# Patient Record
Sex: Female | Born: 1970 | Race: White | Hispanic: No | Marital: Married | Smoking: Current every day smoker
Health system: Southern US, Community
[De-identification: ages and names within clinical notes are randomized; demographics above are authoritative.]

## PROBLEM LIST (undated history)

## (undated) DIAGNOSIS — F419 Anxiety disorder, unspecified: Secondary | ICD-10-CM

## (undated) DIAGNOSIS — K219 Gastro-esophageal reflux disease without esophagitis: Secondary | ICD-10-CM

## (undated) DIAGNOSIS — G43909 Migraine, unspecified, not intractable, without status migrainosus: Secondary | ICD-10-CM

## (undated) DIAGNOSIS — I10 Essential (primary) hypertension: Secondary | ICD-10-CM

## (undated) DIAGNOSIS — E7800 Pure hypercholesterolemia, unspecified: Secondary | ICD-10-CM

## (undated) HISTORY — PX: FOOT SURGERY: SHX648

## (undated) HISTORY — DX: Migraine, unspecified, not intractable, without status migrainosus: G43.909

## (undated) HISTORY — PX: OTHER SURGICAL HISTORY: SHX169

## (undated) HISTORY — DX: Pure hypercholesterolemia, unspecified: E78.00

## (undated) HISTORY — DX: Gastro-esophageal reflux disease without esophagitis: K21.9

## (undated) HISTORY — DX: Anxiety disorder, unspecified: F41.9

---

## 1998-07-01 ENCOUNTER — Emergency Department (HOSPITAL_COMMUNITY): Admission: EM | Admit: 1998-07-01 | Discharge: 1998-07-01 | Payer: Self-pay | Admitting: Emergency Medicine

## 2013-09-05 ENCOUNTER — Encounter (HOSPITAL_COMMUNITY)
Admission: RE | Admit: 2013-09-05 | Discharge: 2013-09-05 | Disposition: A | Discharge status: Home / Self Care | Payer: BC Managed Care – PPO | Source: Ambulatory Visit | Attending: Obstetrics & Gynecology | Admitting: Obstetrics & Gynecology

## 2013-09-05 ENCOUNTER — Encounter (HOSPITAL_COMMUNITY): Payer: Self-pay

## 2013-09-05 ENCOUNTER — Encounter (INDEPENDENT_AMBULATORY_CARE_PROVIDER_SITE_OTHER): Payer: Self-pay

## 2013-09-05 ENCOUNTER — Encounter (HOSPITAL_COMMUNITY): Payer: Self-pay | Admitting: Pharmacist

## 2013-09-05 DIAGNOSIS — Z01812 Encounter for preprocedural laboratory examination: Secondary | ICD-10-CM | POA: Insufficient documentation

## 2013-09-05 DIAGNOSIS — N920 Excessive and frequent menstruation with regular cycle: Secondary | ICD-10-CM | POA: Insufficient documentation

## 2013-09-05 LAB — CBC
HCT: 39.1 % (ref 36.0–46.0)
HEMOGLOBIN: 13.5 g/dL (ref 12.0–15.0)
MCH: 31.8 pg (ref 26.0–34.0)
MCHC: 34.5 g/dL (ref 30.0–36.0)
MCV: 92 fL (ref 78.0–100.0)
PLATELETS: 256 10*3/uL (ref 150–400)
RBC: 4.25 MIL/uL (ref 3.87–5.11)
RDW: 12.9 % (ref 11.5–15.5)
WBC: 7.4 10*3/uL (ref 4.0–10.5)

## 2013-09-05 NOTE — Patient Instructions (Addendum)
   Your procedure is scheduled on:  Thursday, Mar 26  Enter through the [employer] of [facility] at:  6 AM Pick up the phone at the desk and dial [phone] and inform us of your arrival.  Please call this number if you have any problems the morning of surgery: [phone]  Remember: Do not eat or drink after midnight:  Wednesday Take these medicines the morning of surgery with a SIP OF WATER:  None  Do not wear jewelry, make-up, or FINGER nail polish No metal in your hair or on your body. Do not wear lotions, powders, perfumes.  You may wear deodorant.  Do not bring valuables to the hospital. Contacts, dentures or bridgework may not be worn into surgery.  Leave suitcase in the car. After Surgery it may be brought to your room. For patients being admitted to the hospital, checkout time is 11:00am the day of discharge.  Home with husband [place] cell [phone].

## 2013-09-09 NOTE — H&P (Signed)
Melanie Powell is an 43 y.o. female with menorrhagia and dysmenorrhea.  She declines medical management and wants definitive management.  Uterus on ultrasound is 224 cc volume with 5.7cm fibroid noted.  The patient does admit to consuming 6-8 beers nightly.  She wants ovarian preservation.  Pertinent Gynecological History: Menses: flow is excessive with use of 10 pads or tampons on heaviest days Bleeding: dysfunctional uterine bleeding Contraception: vasectomy DES exposure: unknown Blood transfusions: none Sexually transmitted diseases: no past history Previous GYN Procedures: none  Last mammogram: n/a Date: n/a Last pap: normal Date: 69 OB History: G3, P3   Menstrual History: Menarche age: n/a No LMP recorded.    Past Medical History  Diagnosis Date  . SVD (spontaneous vaginal delivery)     x 3    Past Surgical History  Procedure Laterality Date  . Right side lung surgery      portaion of right lower lobe removed per patient - pt was in her 20's - no problems ever after surgery    No family history on file.  Social History:  reports that she has been smoking.  She has never used smokeless tobacco. She reports that she drinks alcohol. She reports that she does not use illicit drugs.  Allergies: No Known Allergies  No prescriptions prior to admission    ROS  There were no vitals taken for this visit. Physical Exam  Constitutional: She is oriented to person, place, and time. She appears well-developed and well-nourished.  GI: Soft. There is no rebound and no guarding.  Neurological: She is alert and oriented to person, place, and time.  Skin: Skin is warm and dry.  Psychiatric: She has a normal mood and affect. Her behavior is normal.    No results found for this or any previous visit (from the past 24 hour(s)).  No results found.  Assessment/Plan: 43yo G3P3 with menorrhagia, dysmenorrhea -LAVH  Melanie Powell 09/09/2013, 8:57 PM

## 2013-09-10 NOTE — Pre-Procedure Instructions (Signed)
Dr. Josephine Igo made aware of conversation I had with heather at Dr. Lynnette Caffey' office.  Nira Conn stated pt has history of alcohol abuse.  I made Dr. Royce Macadamia aware labs orders and place in [facility] for day of surgery.

## 2013-09-11 ENCOUNTER — Encounter (HOSPITAL_COMMUNITY)
Admission: RE | Disposition: A | Discharge status: Home / Self Care | Payer: Self-pay | Source: Ambulatory Visit | Attending: Obstetrics & Gynecology

## 2013-09-11 ENCOUNTER — Ambulatory Visit (HOSPITAL_COMMUNITY): Payer: BC Managed Care – PPO | Admitting: Anesthesiology

## 2013-09-11 ENCOUNTER — Encounter (HOSPITAL_COMMUNITY): Payer: Self-pay

## 2013-09-11 ENCOUNTER — Encounter (HOSPITAL_COMMUNITY): Payer: BC Managed Care – PPO | Admitting: Anesthesiology

## 2013-09-11 ENCOUNTER — Observation Stay (HOSPITAL_COMMUNITY)
Admission: RE | Admit: 2013-09-11 | Discharge: 2013-09-12 | Disposition: A | Discharge status: Home / Self Care | Payer: BC Managed Care – PPO | Source: Ambulatory Visit | Attending: Obstetrics & Gynecology | Admitting: Obstetrics & Gynecology

## 2013-09-11 DIAGNOSIS — N920 Excessive and frequent menstruation with regular cycle: Principal | ICD-10-CM | POA: Insufficient documentation

## 2013-09-11 DIAGNOSIS — D250 Submucous leiomyoma of uterus: Secondary | ICD-10-CM | POA: Insufficient documentation

## 2013-09-11 DIAGNOSIS — N946 Dysmenorrhea, unspecified: Secondary | ICD-10-CM | POA: Insufficient documentation

## 2013-09-11 DIAGNOSIS — N852 Hypertrophy of uterus: Secondary | ICD-10-CM | POA: Insufficient documentation

## 2013-09-11 DIAGNOSIS — N906 Unspecified hypertrophy of vulva: Secondary | ICD-10-CM | POA: Insufficient documentation

## 2013-09-11 DIAGNOSIS — F17200 Nicotine dependence, unspecified, uncomplicated: Secondary | ICD-10-CM | POA: Insufficient documentation

## 2013-09-11 DIAGNOSIS — L819 Disorder of pigmentation, unspecified: Secondary | ICD-10-CM | POA: Insufficient documentation

## 2013-09-11 DIAGNOSIS — Z90710 Acquired absence of both cervix and uterus: Secondary | ICD-10-CM | POA: Diagnosis present

## 2013-09-11 HISTORY — PX: LAPAROSCOPIC ASSISTED VAGINAL HYSTERECTOMY: SHX5398

## 2013-09-11 LAB — COMPREHENSIVE METABOLIC PANEL
ALK PHOS: 65 U/L (ref 39–117)
ALT: 20 U/L (ref 0–35)
AST: 19 U/L (ref 0–37)
Albumin: 3.8 g/dL (ref 3.5–5.2)
BUN: 11 mg/dL (ref 6–23)
CHLORIDE: 103 meq/L (ref 96–112)
CO2: 25 mEq/L (ref 19–32)
CREATININE: 0.81 mg/dL (ref 0.50–1.10)
Calcium: 9.5 mg/dL (ref 8.4–10.5)
GFR, EST NON AFRICAN AMERICAN: 90 mL/min — AB (ref >90)
GLUCOSE: 105 mg/dL — AB (ref 70–99)
POTASSIUM: 3.9 meq/L (ref 3.7–5.3)
Sodium: 139 mEq/L (ref 137–147)
Total Bilirubin: 0.2 mg/dL — ABNORMAL LOW (ref 0.3–1.2)
Total Protein: 6.7 g/dL (ref 6.0–8.3)

## 2013-09-11 LAB — CBC
HEMATOCRIT: 39.1 % (ref 36.0–46.0)
Hemoglobin: 13.1 g/dL (ref 12.0–15.0)
MCH: 31.4 pg (ref 26.0–34.0)
MCHC: 33.5 g/dL (ref 30.0–36.0)
MCV: 93.8 fL (ref 78.0–100.0)
Platelets: 264 10*3/uL (ref 150–400)
RBC: 4.17 MIL/uL (ref 3.87–5.11)
RDW: 13.1 % (ref 11.5–15.5)
WBC: 9.5 10*3/uL (ref 4.0–10.5)

## 2013-09-11 LAB — APTT: aPTT: 30 seconds (ref 24–37)

## 2013-09-11 LAB — PROTIME-INR
INR: 0.93 (ref 0.00–1.49)
Prothrombin Time: 12.3 seconds (ref 11.6–15.2)

## 2013-09-11 LAB — PREGNANCY, URINE: PREG TEST UR: NEGATIVE

## 2013-09-11 SURGERY — HYSTERECTOMY, VAGINAL, LAPAROSCOPY-ASSISTED
Anesthesia: General | Site: Abdomen

## 2013-09-11 MED ORDER — BUPIVACAINE HCL (PF) 0.25 % IJ SOLN
INTRAMUSCULAR | Status: DC | PRN
Start: 2013-09-11 — End: 2013-09-11
  Administered 2013-09-11: 4 mL

## 2013-09-11 MED ORDER — ONDANSETRON HCL 4 MG PO TABS
4.0000 mg | ORAL_TABLET | Freq: Four times a day (QID) | ORAL | Status: DC | PRN
Start: 2013-09-11 — End: 2013-09-12

## 2013-09-11 MED ORDER — MIDAZOLAM HCL 2 MG/2ML IJ SOLN
INTRAMUSCULAR | Status: DC | PRN
  Administered 2013-09-11: 2 mg via INTRAVENOUS

## 2013-09-11 MED ORDER — DEXTROSE IN LACTATED RINGERS 5 % IV SOLN
INTRAVENOUS | Status: DC
  Administered 2013-09-11: 13:00:00 via INTRAVENOUS

## 2013-09-11 MED ORDER — NEOSTIGMINE METHYLSULFATE 1 MG/ML IJ SOLN
INTRAMUSCULAR | Status: DC | PRN
  Administered 2013-09-11: 4 mg via INTRAVENOUS

## 2013-09-11 MED ORDER — DEXTROSE IN LACTATED RINGERS 5 % IV SOLN: INTRAVENOUS | Status: DC

## 2013-09-11 MED ORDER — PROPOFOL 10 MG/ML IV EMUL
INTRAVENOUS | Status: AC
  Filled 2013-09-11: qty 20

## 2013-09-11 MED ORDER — FENTANYL CITRATE 0.05 MG/ML IJ SOLN
INTRAMUSCULAR | Status: DC | PRN
  Administered 2013-09-11 (×2): 50 ug via INTRAVENOUS
  Administered 2013-09-11: 100 ug via INTRAVENOUS
  Administered 2013-09-11: 25 ug via INTRAVENOUS
  Administered 2013-09-11: 50 ug via INTRAVENOUS
  Administered 2013-09-11: 100 ug via INTRAVENOUS
  Administered 2013-09-11: 75 ug via INTRAVENOUS
  Administered 2013-09-11: 50 ug via INTRAVENOUS

## 2013-09-11 MED ORDER — ROCURONIUM BROMIDE 100 MG/10ML IV SOLN
INTRAVENOUS | Status: AC
  Filled 2013-09-11: qty 1

## 2013-09-11 MED ORDER — LIDOCAINE HCL (CARDIAC) 20 MG/ML IV SOLN
INTRAVENOUS | Status: DC | PRN
  Administered 2013-09-11: 30 mg via INTRAVENOUS

## 2013-09-11 MED ORDER — KETOROLAC TROMETHAMINE 30 MG/ML IJ SOLN: 30.0000 mg | Freq: Four times a day (QID) | INTRAMUSCULAR | Status: DC

## 2013-09-11 MED ORDER — FENTANYL CITRATE 0.05 MG/ML IJ SOLN
INTRAMUSCULAR | Status: AC
  Filled 2013-09-11: qty 5

## 2013-09-11 MED ORDER — HYDROMORPHONE HCL PF 1 MG/ML IJ SOLN
0.2000 mg | INTRAMUSCULAR | Status: DC | PRN
  Administered 2013-09-11: 0.6 mg via INTRAVENOUS
  Filled 2013-09-11: qty 1

## 2013-09-11 MED ORDER — LIDOCAINE HCL (CARDIAC) 20 MG/ML IV SOLN
INTRAVENOUS | Status: AC
  Filled 2013-09-11: qty 5

## 2013-09-11 MED ORDER — ONDANSETRON HCL 4 MG/2ML IJ SOLN
INTRAMUSCULAR | Status: DC | PRN
  Administered 2013-09-11: 4 mg via INTRAVENOUS

## 2013-09-11 MED ORDER — KETOROLAC TROMETHAMINE 30 MG/ML IJ SOLN
30.0000 mg | Freq: Four times a day (QID) | INTRAMUSCULAR | Status: DC
  Administered 2013-09-11 – 2013-09-12 (×4): 30 mg via INTRAVENOUS
  Filled 2013-09-11 (×4): qty 1

## 2013-09-11 MED ORDER — ALUM & MAG HYDROXIDE-SIMETH 200-200-20 MG/5ML PO SUSP: 30.0000 mL | ORAL | Status: DC | PRN

## 2013-09-11 MED ORDER — MEPERIDINE HCL 25 MG/ML IJ SOLN: 6.2500 mg | INTRAMUSCULAR | Status: DC | PRN

## 2013-09-11 MED ORDER — LACTATED RINGERS IR SOLN
Status: DC | PRN
  Administered 2013-09-11: 3000 mL

## 2013-09-11 MED ORDER — DEXAMETHASONE SODIUM PHOSPHATE 10 MG/ML IJ SOLN
INTRAMUSCULAR | Status: DC | PRN
  Administered 2013-09-11: 5 mg via INTRAVENOUS

## 2013-09-11 MED ORDER — CEFAZOLIN SODIUM-DEXTROSE 2-3 GM-% IV SOLR
INTRAVENOUS | Status: AC
  Filled 2013-09-11: qty 50

## 2013-09-11 MED ORDER — CEFAZOLIN SODIUM-DEXTROSE 2-3 GM-% IV SOLR
2.0000 g | INTRAVENOUS | Status: AC
  Administered 2013-09-11: 2 g via INTRAVENOUS

## 2013-09-11 MED ORDER — HYDROMORPHONE HCL PF 1 MG/ML IJ SOLN
0.2500 mg | INTRAMUSCULAR | Status: DC | PRN
  Administered 2013-09-11 (×2): 0.5 mg via INTRAVENOUS

## 2013-09-11 MED ORDER — HYDROMORPHONE HCL PF 1 MG/ML IJ SOLN
INTRAMUSCULAR | Status: DC | PRN
  Administered 2013-09-11: .25 mg via INTRAVENOUS

## 2013-09-11 MED ORDER — ONDANSETRON HCL 4 MG/2ML IJ SOLN
INTRAMUSCULAR | Status: AC
  Filled 2013-09-11: qty 2

## 2013-09-11 MED ORDER — DEXAMETHASONE SODIUM PHOSPHATE 10 MG/ML IJ SOLN
INTRAMUSCULAR | Status: AC
  Filled 2013-09-11: qty 1

## 2013-09-11 MED ORDER — LORAZEPAM 1 MG PO TABS: 1.0000 mg | ORAL_TABLET | Freq: Four times a day (QID) | ORAL | Status: DC | PRN

## 2013-09-11 MED ORDER — MENTHOL 3 MG MT LOZG: 1.0000 | LOZENGE | OROMUCOSAL | Status: DC | PRN

## 2013-09-11 MED ORDER — MIDAZOLAM HCL 2 MG/2ML IJ SOLN
INTRAMUSCULAR | Status: AC
  Filled 2013-09-11: qty 2

## 2013-09-11 MED ORDER — SIMETHICONE 80 MG PO CHEW: 80.0000 mg | CHEWABLE_TABLET | Freq: Four times a day (QID) | ORAL | Status: DC | PRN

## 2013-09-11 MED ORDER — ROCURONIUM BROMIDE 100 MG/10ML IV SOLN
INTRAVENOUS | Status: DC | PRN
  Administered 2013-09-11: 5 mg via INTRAVENOUS
  Administered 2013-09-11: 10 mg via INTRAVENOUS
  Administered 2013-09-11: 35 mg via INTRAVENOUS
  Administered 2013-09-11: 10 mg via INTRAVENOUS
  Administered 2013-09-11 (×5): 5 mg via INTRAVENOUS

## 2013-09-11 MED ORDER — DOCUSATE SODIUM 100 MG PO CAPS
100.0000 mg | ORAL_CAPSULE | Freq: Two times a day (BID) | ORAL | Status: DC
  Administered 2013-09-11: 100 mg via ORAL
  Filled 2013-09-11: qty 1

## 2013-09-11 MED ORDER — LACTATED RINGERS IV SOLN: INTRAVENOUS | Status: DC

## 2013-09-11 MED ORDER — BUPIVACAINE HCL (PF) 0.25 % IJ SOLN
INTRAMUSCULAR | Status: AC
  Filled 2013-09-11: qty 30

## 2013-09-11 MED ORDER — GLYCOPYRROLATE 0.2 MG/ML IJ SOLN
INTRAMUSCULAR | Status: DC | PRN
  Administered 2013-09-11: .8 mg via INTRAVENOUS

## 2013-09-11 MED ORDER — OXYCODONE-ACETAMINOPHEN 5-325 MG PO TABS
1.0000 | ORAL_TABLET | ORAL | Status: DC | PRN
  Administered 2013-09-11 – 2013-09-12 (×3): 1 via ORAL
  Filled 2013-09-11: qty 2
  Filled 2013-09-11 (×2): qty 1

## 2013-09-11 MED ORDER — LACTATED RINGERS IV SOLN
INTRAVENOUS | Status: DC
  Administered 2013-09-11 (×3): via INTRAVENOUS

## 2013-09-11 MED ORDER — PROMETHAZINE HCL 25 MG/ML IJ SOLN: 6.2500 mg | INTRAMUSCULAR | Status: DC | PRN

## 2013-09-11 MED ORDER — PROPOFOL 10 MG/ML IV BOLUS
INTRAVENOUS | Status: DC | PRN
  Administered 2013-09-11: 150 mg via INTRAVENOUS
  Administered 2013-09-11 (×2): 50 mg via INTRAVENOUS

## 2013-09-11 MED ORDER — HYDROMORPHONE HCL PF 1 MG/ML IJ SOLN
INTRAMUSCULAR | Status: DC | PRN
Start: 2013-09-11 — End: 2013-09-11

## 2013-09-11 MED ORDER — HYDROMORPHONE HCL PF 1 MG/ML IJ SOLN
INTRAMUSCULAR | Status: AC
  Filled 2013-09-11: qty 1

## 2013-09-11 MED ORDER — HYDROMORPHONE HCL PF 1 MG/ML IJ SOLN
INTRAMUSCULAR | Status: AC
  Administered 2013-09-11: 0.5 mg via INTRAVENOUS
  Filled 2013-09-11: qty 1

## 2013-09-11 MED ORDER — ONDANSETRON HCL 4 MG/2ML IJ SOLN: 4.0000 mg | Freq: Four times a day (QID) | INTRAMUSCULAR | Status: DC | PRN

## 2013-09-11 SURGICAL SUPPLY — 47 items
ADH SKN CLS APL DERMABOND .7 (GAUZE/BANDAGES/DRESSINGS) ×1
BLADE 15 SAFETY STRL DISP (BLADE) ×2 IMPLANT
CABLE HIGH FREQUENCY MONO STRZ (ELECTRODE) IMPLANT
CANISTER SUCT 3000ML (MISCELLANEOUS) ×3 IMPLANT
CATH ROBINSON RED A/P 16FR (CATHETERS) ×3 IMPLANT
CHLORAPREP W/TINT 26ML (MISCELLANEOUS) ×3 IMPLANT
CLOTH BEACON ORANGE TIMEOUT ST (SAFETY) ×3 IMPLANT
COVER TABLE BACK 60X90 (DRAPES) ×3 IMPLANT
DECANTER SPIKE VIAL GLASS SM (MISCELLANEOUS) ×2 IMPLANT
DERMABOND ADVANCED (GAUZE/BANDAGES/DRESSINGS) ×2
DERMABOND ADVANCED .7 DNX12 (GAUZE/BANDAGES/DRESSINGS) ×1 IMPLANT
DRSG TELFA 3X8 NADH (GAUZE/BANDAGES/DRESSINGS) ×3 IMPLANT
ELECT LIGASURE LONG (ELECTRODE) ×2 IMPLANT
ELECT REM PT RETURN 9FT ADLT (ELECTROSURGICAL) ×3
ELECTRODE REM PT RTRN 9FT ADLT (ELECTROSURGICAL) IMPLANT
FILTER SMOKE EVAC LAPAROSHD (FILTER) ×3 IMPLANT
FORCEPS CUTTING 45CM 5MM (CUTTING FORCEPS) ×2 IMPLANT
GLOVE BIO SURGEON STRL SZ 6 (GLOVE) ×6 IMPLANT
GLOVE BIOGEL PI IND STRL 6 (GLOVE) ×2 IMPLANT
GLOVE BIOGEL PI IND STRL 7.0 (GLOVE) IMPLANT
GLOVE BIOGEL PI INDICATOR 6 (GLOVE) ×4
GLOVE BIOGEL PI INDICATOR 7.0 (GLOVE) ×8
GLOVE ECLIPSE 6.5 STRL STRAW (GLOVE) ×6 IMPLANT
GLOVE ECLIPSE 7.0 STRL STRAW (GLOVE) ×6 IMPLANT
GLOVE SURG SS PI 7.0 STRL IVOR (GLOVE) ×16 IMPLANT
GOWN STRL REUS W/TWL LRG LVL3 (GOWN DISPOSABLE) ×12 IMPLANT
MANIPULATOR UTERINE 4.5 ZUMI (MISCELLANEOUS) ×3 IMPLANT
NEEDLE INSUFFLATION 120MM (ENDOMECHANICALS) ×3 IMPLANT
NS IRRIG 1000ML POUR BTL (IV SOLUTION) ×3 IMPLANT
PACK LAVH (CUSTOM PROCEDURE TRAY) ×3 IMPLANT
PAD DRESSING TELFA 3X8 NADH (GAUZE/BANDAGES/DRESSINGS) IMPLANT
PROTECTOR NERVE ULNAR (MISCELLANEOUS) ×5 IMPLANT
SET IRRIG TUBING LAPAROSCOPIC (IRRIGATION / IRRIGATOR) ×2 IMPLANT
SUT MNCRL 0 MO-4 VIOLET 18 CR (SUTURE) ×2 IMPLANT
SUT MNCRL AB 3-0 PS2 27 (SUTURE) ×3 IMPLANT
SUT MON AB 2-0 CT1 36 (SUTURE) ×3 IMPLANT
SUT MONOCRYL 0 MO 4 18  CR/8 (SUTURE) ×4
SUT VIC AB 3-0 SH 27 (SUTURE) ×6
SUT VIC AB 3-0 SH 27X BRD (SUTURE) IMPLANT
SUT VICRYL 0 TIES 12 18 (SUTURE) ×3 IMPLANT
SUT VICRYL 0 UR6 27IN ABS (SUTURE) ×3 IMPLANT
TOWEL OR 17X24 6PK STRL BLUE (TOWEL DISPOSABLE) ×6 IMPLANT
TRAY FOLEY CATH 14FR (SET/KITS/TRAYS/PACK) ×3 IMPLANT
TROCAR XCEL NON-BLD 11X100MML (ENDOMECHANICALS) ×3 IMPLANT
TROCAR XCEL NON-BLD 5MMX100MML (ENDOMECHANICALS) ×3 IMPLANT
TROCAR XCEL OPT SLVE 5M 100M (ENDOMECHANICALS) ×3 IMPLANT
WARMER LAPAROSCOPE (MISCELLANEOUS) ×3 IMPLANT

## 2013-09-11 NOTE — Anesthesia Preprocedure Evaluation (Signed)
Anesthesia Evaluation  Patient identified by MRN, date of birth, ID band Patient awake    Reviewed: Allergy & Precautions, H&P , NPO status , Patient's Chart, lab work & pertinent test results  Airway Mallampati: II TM Distance: >3 FB Neck ROM: Full    Dental no notable dental hx.    Pulmonary Current Smoker,  breath sounds clear to auscultation  Pulmonary exam normal       Cardiovascular negative cardio ROS  Rhythm:Regular Rate:Normal     Neuro/Psych negative neurological ROS  negative psych ROS   GI/Hepatic negative GI ROS, Neg liver ROS,   Endo/Other  negative endocrine ROS  Renal/GU negative Renal ROS  negative genitourinary   Musculoskeletal negative musculoskeletal ROS (+)   Abdominal   Peds negative pediatric ROS (+)  Hematology negative hematology ROS (+)   Anesthesia Other Findings   Reproductive/Obstetrics negative OB ROS                           Anesthesia Physical Anesthesia Plan  ASA: II  Anesthesia Plan: General   Post-op Pain Management:    Induction: Intravenous  Airway Management Planned: Oral ETT  Additional Equipment:   Intra-op Plan:   Post-operative Plan: Extubation in OR  Informed Consent: I have reviewed the patients History and Physical, chart, labs and discussed the procedure including the risks, benefits and alternatives for the proposed anesthesia with the patient or authorized representative who has indicated his/her understanding and acceptance.   Dental advisory given  Plan Discussed with: CRNA  Anesthesia Plan Comments:         Anesthesia Quick Evaluation  

## 2013-09-11 NOTE — Progress Notes (Signed)
No change to H&P. 

## 2013-09-11 NOTE — Anesthesia Postprocedure Evaluation (Signed)
  Anesthesia Post-op Note  Patient: Melanie Powell  Procedure(s) Performed: Procedure(s): LAPAROSCOPIC ASSISTED VAGINAL HYSTERECTOMY with bilateral salpingectomy and right labial biopsy with posterior fourchette (N/A)  Patient Location: [facility]  Anesthesia Type:General  Level of Consciousness: awake  Airway and Oxygen Therapy: Patient Spontanous Breathing  Post-op Pain: mild  Post-op Assessment: Patient's Cardiovascular Status Stable and Respiratory Function Stable  Post-op Vital Signs: stable  Complications: No apparent anesthesia complications

## 2013-09-11 NOTE — Addendum Note (Signed)
Addendum created 09/11/13 1527 by Ignacia Bayley, CRNA   Modules edited: Notes Section   Notes Section:  File: [id number]

## 2013-09-11 NOTE — Transfer of Care (Signed)
Immediate Anesthesia Transfer of Care Note  Patient: Melanie Powell  Procedure(s) Performed: Procedure(s): LAPAROSCOPIC ASSISTED VAGINAL HYSTERECTOMY with bilateral salpingectomy and right labial biopsy with posterior fourchette (N/A)  Patient Location: PACU  Anesthesia Type:General  Level of Consciousness: awake and alert   Airway & Oxygen Therapy: Patient Spontanous Breathing and Patient connected to nasal cannula oxygen  Post-op Assessment: Report given to PACU RN and Post -op Vital signs reviewed and stable  Post vital signs: Reviewed and stable  Complications: No apparent anesthesia complications

## 2013-09-11 NOTE — Op Note (Signed)
PROCEDURE DATE: 09/11/2013 PREOPERATIVE DIAGNOSIS: Menorrhagia, dysmenorrhea  POSTOPERATIVE DIAGNOSIS: The same  PROCEDURE: Laparoscopic Assisted Vaginal Hysterectomy, posterior fourchette and right labial biopsies SURGEON: Dr. Linda Hedges  ASSISTANT: Dr. Molli Posey INDICATIONS: 43 y.o. G3P3 with menorrhagia and dysmenorrhea desiring definitive surgical management. Risks of surgery were discussed with the patient including but not limited to: bleeding which may require transfusion or reoperation; infection which may require antibiotics; injury to bowel, bladder, ureters or other surrounding organs; need for additional procedures including laparotomy; thromboembolic phenomenon, incisional problems and other postoperative/anesthesia complications. Written informed consent was obtained.  FINDINGS: Enlarged uterus (256 gm), normal adnexa bilaterally. No evidence of endometriosis. Normal upper abdomen. Normal appendix ANESTHESIA: General  ESTIMATED BLOOD LOSS: 150 ml  SPECIMENS: Uterus, fallopian tubes, and cervix, posterior fourchette biopsy, right labial biopsy  COMPLICATIONS: None immediate  PROCEDURE IN DETAIL: The patient received intravenous antibiotics and had sequential compression devices applied to her lower extremities while in the preoperative area. She was then taken to the operating room where general anesthesia was administered and was found to be adequate. She was placed in the dorsal lithotomy position, and was prepped and draped in a sterile manner. An in and out catheterization was performed. A uterine manipulator was then advanced into the uterus . After an adequate timeout was performed, attention was then turned to the patient's abdomen where a 10-mm skin incision was made in the umbilical fold. The Veress needle was carefully introduced into the peritoneal cavity through the abdominal wall. Intraperitoneal placement was confirmed by drop in intraabdominal pressure with insufflation  of carbon dioxide gas. Adequate pneumoperitoneum was obtained, and the 10 mm trocar and sleeve were then advanced without difficulty into the abdomen where intraabdominal placement was confirmed by the laparoscope. A survey of the patient's pelvis and abdomen revealed the above listed findings. Suprapubic 5 mm port was then placed under direct visualization. The pelvis was then carefully examined. On the right side, the fallopian tube was freed from the mesosalpinx.  The round ligament was then clamped and transected with the Gyrus. The uteroovarian ligament was also clamped and transected. The leaves of the broad ligament were separated and serially transected. These procedures were then repeated on the left side. The ureters were noted to be safely away from the area of dissection.  At this point, attention was turned to the vaginal portion of the case. A weighted speculum was placed posteriorly, a Deaver anteriorly, and the cervix grasped with a thyroid tenaculum. Once the anterior and posterior reflections were identified, the cervix was circumscribed using the Bovie knife. Next, using Mayos, the posterior cul-de-sac was entered. The LigaSure was then used to grasp the uterosacrals which were coapted and cut. Next, the bladder reflection was identified. Using Metzenbaums, it was entered and palpation and direct visualization confirmed proper location. Next, using the LigaSure, the uterine arteries were coapted and cut bilaterally. The pedicles were visualized after coaptation and were hemostatic. The same was performed sequentially cephalad until the uterus and cervix were removed after morcellation. The pedicles were inspected and found to be hemostatic.  Next, the uterosacrals were tagged with monocryl bilaterally. The posterior peritoneum was closed using monocryl in a purse-string fashion. The uterosacrals were brought together in the midline cuff closure with a figure-of-8 stitch using monocryl followed  by the remainder of the cuff closure in the same fashion. The cuff was inspected and found to be hemostatic. Using an 11 blade, the hyperpigmented area of the posterior fourchette was removed.  The  base was rendered hemostatic using Bovie cautery.  The skin edges were reapproximated using 3-0 Vicryl using horizontal mattress stitches.  The hyperpigmented area on the right labia was treated in the same fashion with an elipse removed.  Hemostasis of both areas was noted.    Attention was returned to the abdomen were a second laparoscopic look was taken. All pedicles were hemostatic. Insufflation was removed after all instruments were removed.  The infraumbilical fascial incision was closed with a 2-0 figure-of-eight vicryl stitch.  All skin incisions were closed with 4-0 Vicryl subcuticular stitches and Dermabond. The patient tolerated the procedures well. All instruments, needles, and sponge counts were correct x 2. The patient was taken to the recovery room awake, extubated and in stable condition.

## 2013-09-11 NOTE — Progress Notes (Signed)
-  NOS-  No current c/o.  Tolerating full diet.  No nausea or vomiting.  Well-controlled pain.  No CP/SOB.  Has ambulated to the hallway.    VSS.  AF. UOP adequate  Gen: A&Ox3 Abd: soft, ND, inc c/d/i x 2 Ext: +SCDs, no c/c/e  42yo POD#0 s/p LAVH and vulvar biopsies -Continue pain management; will transition to po meds tonight -D/C ivf and foley in AM -AM labs pending -Likely d/c home tomorrow  Linda Hedges, DO

## 2013-09-11 NOTE — Anesthesia Postprocedure Evaluation (Signed)
  Anesthesia Post-op Note  Patient: Melanie Powell  Procedure(s) Performed: Procedure(s): LAPAROSCOPIC ASSISTED VAGINAL HYSTERECTOMY with bilateral salpingectomy and right labial biopsy with posterior fourchette (N/A)  Patient Location: PACU  Anesthesia Type:General  Level of Consciousness: awake, alert  and oriented  Airway and Oxygen Therapy: Patient Spontanous Breathing  Post-op Pain: mild  Post-op Assessment: Post-op Vital signs reviewed, Patient's Cardiovascular Status Stable, Respiratory Function Stable, Patent Airway, No signs of Nausea or vomiting and Pain level controlled  Post-op Vital Signs: Reviewed and stable  Complications: No apparent anesthesia complications

## 2013-09-12 ENCOUNTER — Encounter (HOSPITAL_COMMUNITY): Payer: Self-pay | Admitting: Obstetrics & Gynecology

## 2013-09-12 LAB — COMPREHENSIVE METABOLIC PANEL
ALK PHOS: 51 U/L (ref 39–117)
ALT: 12 U/L (ref 0–35)
AST: 13 U/L (ref 0–37)
Albumin: 2.7 g/dL — ABNORMAL LOW (ref 3.5–5.2)
BUN: 7 mg/dL (ref 6–23)
CHLORIDE: 105 meq/L (ref 96–112)
CO2: 28 meq/L (ref 19–32)
CREATININE: 0.75 mg/dL (ref 0.50–1.10)
Calcium: 8.4 mg/dL (ref 8.4–10.5)
GFR calc Af Amer: >90 mL/min (ref >90)
Glucose, Bld: 105 mg/dL — ABNORMAL HIGH (ref 70–99)
POTASSIUM: 4.4 meq/L (ref 3.7–5.3)
Sodium: 141 mEq/L (ref 137–147)
Total Protein: 5.2 g/dL — ABNORMAL LOW (ref 6.0–8.3)

## 2013-09-12 LAB — CBC
HCT: 31.2 % — ABNORMAL LOW (ref 36.0–46.0)
Hemoglobin: 10.2 g/dL — ABNORMAL LOW (ref 12.0–15.0)
MCH: 31.1 pg (ref 26.0–34.0)
MCHC: 32.7 g/dL (ref 30.0–36.0)
MCV: 95.1 fL (ref 78.0–100.0)
PLATELETS: 200 10*3/uL (ref 150–400)
RBC: 3.28 MIL/uL — AB (ref 3.87–5.11)
RDW: 13.1 % (ref 11.5–15.5)
WBC: 13.9 10*3/uL — ABNORMAL HIGH (ref 4.0–10.5)

## 2013-09-12 MED ORDER — OXYCODONE-ACETAMINOPHEN 5-325 MG PO TABS: 1.0000 | ORAL_TABLET | ORAL | Status: DC | PRN

## 2013-09-12 MED ORDER — IBUPROFEN 800 MG PO TABS: 800.0000 mg | ORAL_TABLET | Freq: Three times a day (TID) | ORAL | Status: DC | PRN

## 2013-09-12 NOTE — Progress Notes (Signed)
Pt is discharged in the care of husband with N>T> escort. Denies any pain or discomfort. Spirits are good. Abd lapsites are clean and dry. Discharged instructions with Rx were given to pt. Questions were asked and answered. No equipment needed for home. Stable.

## 2013-09-12 NOTE — Discharge Instructions (Signed)
Call MD for T>100.4, heavy vaginal bleeding, severe abdominal pain, intractable nausea and/or vomiting, or respiratory distress.  Call office to schedule postop appointment in 2 weeks.  No driving while taking narcotics.  Pelvic rest and no heavy lifting x 6 weeks.

## 2013-09-12 NOTE — Progress Notes (Signed)
UR chart review completed.  

## 2013-09-12 NOTE — Discharge Summary (Signed)
Physician Discharge Summary  Patient ID: NORMAN BIER MRN: [id number] DOB/AGE: Sep 12, 1970 43 y.o.  Admit date: 09/11/2013 Discharge date: 09/12/2013  Admission Diagnoses:  Menorrhagia, dysmenorrhea  Discharge Diagnoses: SAA Active Problems:   S/P hysterectomy   Discharged Condition: good  Hospital Course: Admitted patient with planned definitive management of menorrhagia and dysmenorrhea with hysterectomy.  The procedure was performed without complication and additionally, hyperpigmented areas on the posterior fourchette and right labia were biopsied.  On POD#1, the patient was meeting all goals and requesting discharge home.    Consults: None  Significant Diagnostic Studies: none  Treatments: surgery: LAVH, labial biopsies  Discharge Exam: Blood pressure 112/66, pulse 54, temperature 98.1 F (36.7 C), temperature source Oral, resp. rate 18, last menstrual period 08/19/2013, SpO2 98.00%. General appearance: alert, cooperative and appears stated age GI: soft, non-tender; bowel sounds normal; no masses,  no organomegaly Extremities: extremities normal, atraumatic, no cyanosis or edema Incision/Wound:c/d/i x 2  Disposition: Final discharge disposition not confirmed     Medication List    STOP taking these medications       ferrous sulfate 324 (65 FE) MG Tbec     HYDROcodone-acetaminophen 5-325 MG per tablet  Commonly known as:  NORCO/VICODIN      TAKE these medications       acetaminophen 500 MG tablet  Commonly known as:  TYLENOL  Take 1,000 mg by mouth every 6 (six) hours as needed for moderate pain or headache.     ibuprofen 800 MG tablet  Commonly known as:  ADVIL,MOTRIN  Take 1 tablet (800 mg total) by mouth every 8 (eight) hours as needed.     oxyCODONE-acetaminophen 5-325 MG per tablet  Commonly known as:  PERCOCET/ROXICET  Take 1-2 tablets by mouth every 4 (four) hours as needed for severe pain (moderate to severe pain (when tolerating fluids)).          SignedLinda Hedges 09/12/2013, 8:47 AM

## 2013-09-12 NOTE — Progress Notes (Signed)
No current c/o.  Tolerating po.  Ambulating well.  Voiding without difficulty.  Passing flatus.  Pain well-controlled.  No n/v.  VSS. UOP adequate and clear Hgb 10.2  Gen: A&O x 3 Abd: inc c/d/i, ND Ext: no c/c/e  42yo POD#1 s/p LAVH, labial bx -Meeting all goals -D/C home with f/u in the office in 2 weeks.

## 2013-11-01 ENCOUNTER — Encounter (HOSPITAL_COMMUNITY): Payer: Self-pay | Admitting: Emergency Medicine

## 2013-11-01 ENCOUNTER — Emergency Department (INDEPENDENT_AMBULATORY_CARE_PROVIDER_SITE_OTHER): Payer: BC Managed Care – PPO

## 2013-11-01 ENCOUNTER — Emergency Department (HOSPITAL_COMMUNITY)
Admission: EM | Admit: 2013-11-01 | Discharge: 2013-11-01 | Disposition: A | Discharge status: Home / Self Care | Payer: BC Managed Care – PPO | Source: Home / Self Care | Attending: Emergency Medicine | Admitting: Emergency Medicine

## 2013-11-01 DIAGNOSIS — J40 Bronchitis, not specified as acute or chronic: Secondary | ICD-10-CM

## 2013-11-01 MED ORDER — PREDNISONE (PAK) 5 MG PO TABS: ORAL_TABLET | ORAL | Status: DC

## 2013-11-01 MED ORDER — AEROCHAMBER PLUS FLO-VU LARGE MISC: 1.0000 | Freq: Once | Status: DC

## 2013-11-01 MED ORDER — HYDROCOD POLST-CHLORPHEN POLST 10-8 MG/5ML PO LQCR: 5.0000 mL | Freq: Two times a day (BID) | ORAL | Status: DC | PRN

## 2013-11-01 MED ORDER — ALBUTEROL SULFATE HFA 108 (90 BASE) MCG/ACT IN AERS: 2.0000 | INHALATION_SPRAY | RESPIRATORY_TRACT | Status: DC | PRN

## 2013-11-01 NOTE — ED Notes (Signed)
Pt c/o dry cough onset 2 days Cough is worse at night  Denies f/v/n/d, SOB, wheezing Alert w/no signs of acute distress.

## 2013-11-01 NOTE — ED Provider Notes (Signed)
CSN: [id number]     Arrival date & time 11/01/13  1102 History   First MD Initiated Contact with Patient 11/01/13 1223     Chief Complaint  Patient presents with  . Cough   (Consider location/radiation/quality/duration/timing/severity/associated sxs/prior Treatment) HPI Comments: 43 year old female presents complaining of cough, chest tightness with coughing, abdominal pain with coughing. Symptoms have been worsening for 2 days. She has also been experiencing some general body aches. She has been taking over-the-counter medication with no relief of her symptoms. Denies fever, chills, NVD, or abdominal pain at rest. No pleuritic chest pain.  Patient is a 43 y.o. female presenting with cough.  Cough Associated symptoms: myalgias and shortness of breath     Past Medical History  Diagnosis Date  . SVD (spontaneous vaginal delivery)     x 3   Past Surgical History  Procedure Laterality Date  . Right side lung surgery      portaion of right lower lobe removed per patient - pt was in her 20's - no problems ever after surgery  . Laparoscopic assisted vaginal hysterectomy N/A 09/11/2013    Procedure: LAPAROSCOPIC ASSISTED VAGINAL HYSTERECTOMY with bilateral salpingectomy and right labial biopsy with posterior fourchette;  Surgeon: Linda Hedges, DO;  Location: [facility];  Service: Gynecology;  Laterality: N/A;   No family history on file. History  Substance Use Topics  . Smoking status: Current Every Day Smoker -- 1.00 packs/day for 22 years  . Smokeless tobacco: Never Used  . Alcohol Use: 18.0 oz/week    30 Cans of beer per week     Comment: social - beer   OB History   Grav Para Term Preterm Abortions TAB SAB Ect Mult Living                 Review of Systems  Respiratory: Positive for cough, chest tightness and shortness of breath.   Gastrointestinal: Positive for abdominal pain.  Musculoskeletal: Positive for myalgias.  All other systems reviewed and are negative.   Allergies   Review of patient's allergies indicates no known allergies.  Home Medications   Prior to Admission medications   Medication Sig Start Date End Date Taking? Authorizing Provider  acetaminophen (TYLENOL) 500 MG tablet Take 1,000 mg by mouth every 6 (six) hours as needed for moderate pain or headache.    Historical Provider, MD  albuterol (PROVENTIL HFA;VENTOLIN HFA) 108 (90 BASE) MCG/ACT inhaler Inhale 2 puffs into the lungs every 4 (four) hours as needed for wheezing. 11/01/13   Liam Graham, PA-C  chlorpheniramine-HYDROcodone (TUSSIONEX PENNKINETIC ER) 10-8 MG/5ML LQCR Take 5 mLs by mouth every 12 (twelve) hours as needed for cough. 11/01/13   Liam Graham, PA-C  ibuprofen (ADVIL,MOTRIN) 800 MG tablet Take 1 tablet (800 mg total) by mouth every 8 (eight) hours as needed. 09/12/13   Megan Morris, DO  oxyCODONE-acetaminophen (PERCOCET/ROXICET) 5-325 MG per tablet Take 1-2 tablets by mouth every 4 (four) hours as needed for severe pain (moderate to severe pain (when tolerating fluids)). 09/12/13   Linda Hedges, DO  predniSONE (STERAPRED UNI-PAK) 5 MG TABS tablet Use as directed on package instructions 11/01/13   Liam Graham, PA-C  Spacer/Aero-Holding Chambers (AEROCHAMBER PLUS FLO-VU LARGE) MISC 1 each by Other route once. 11/01/13   Freeman Caldron Alexx Giambra, PA-C   BP 143/98  Pulse 71  Temp(Src) 98.2 F (36.8 C) (Oral)  Resp 18  SpO2 100% Physical Exam  Nursing note and vitals reviewed. Constitutional: She is oriented to person, place,  and time. Vital signs are normal. She appears well-developed and well-nourished. No distress.  HENT:  Head: Normocephalic and atraumatic.  Right Ear: External ear normal.  Left Ear: External ear normal.  Nose: Nose normal.  Mouth/Throat: Oropharynx is clear and moist. No oropharyngeal exudate.  Eyes: Conjunctivae are normal. Right eye exhibits no discharge. Left eye exhibits no discharge.  Neck: Normal range of motion. Neck supple. No JVD present.   Cardiovascular: Normal rate, regular rhythm and normal heart sounds.  Exam reveals no gallop and no friction rub.   No murmur heard. Pulmonary/Chest: Effort normal. No respiratory distress. She has wheezes in the right lower field and the left lower field.  Neurological: She is alert and oriented to person, place, and time. She has normal strength. Coordination normal.  Skin: Skin is warm and dry. No rash noted. She is not diaphoretic.  Psychiatric: She has a normal mood and affect. Judgment normal.    ED Course  Procedures (including critical care time) Labs Review Labs Reviewed - No data to display  Imaging Review Dg Abd Acute W/chest  11/01/2013   CLINICAL DATA:  Cough and abdominal pain  EXAM: ACUTE ABDOMEN SERIES (ABDOMEN 2 VIEW & CHEST 1 VIEW)  COMPARISON:  None.  FINDINGS: There is no evidence of dilated bowel loops or free intraperitoneal air. No radiopaque calculi or other significant radiographic abnormality is seen. Heart size and mediastinal contours are within normal limits. Postsurgical change within the right upper lobe is noted. The lungs are hyperinflated and there are coarsened interstitial markings suggestive of emphysema. Both lungs are clear.  IMPRESSION: Negative abdominal radiographs.  No acute cardiopulmonary disease.   Electronically Signed   By: Kerby Moors M.D.   On: 11/01/2013 13:28     MDM   1. Bronchitis    X-rays are negative, the abdominal wall pain should resolve with treatment of cough. If it does not, she will go to the emergency department for evaluation.   Meds ordered this encounter  Medications  . albuterol (PROVENTIL HFA;VENTOLIN HFA) 108 (90 BASE) MCG/ACT inhaler    Sig: Inhale 2 puffs into the lungs every 4 (four) hours as needed for wheezing.    Dispense:  1 Inhaler    Refill:  0    Order Specific Question:  Supervising Provider    Answer:  Jake Michaelis, DAVID C [id number]  . Spacer/Aero-Holding Chambers (AEROCHAMBER PLUS FLO-VU LARGE) MISC     Sig: 1 each by Other route once.    Dispense:  1 each    Refill:  0    Order Specific Question:  Supervising Provider    Answer:  Jake Michaelis, DAVID C [id number]  . predniSONE (STERAPRED UNI-PAK) 5 MG TABS tablet    Sig: Use as directed on package instructions    Dispense:  21 tablet    Refill:  0    Order Specific Question:  Supervising Provider    Answer:  Jake Michaelis, DAVID C [id number]  . chlorpheniramine-HYDROcodone (TUSSIONEX PENNKINETIC ER) 10-8 MG/5ML LQCR    Sig: Take 5 mLs by mouth every 12 (twelve) hours as needed for cough.    Dispense:  115 mL    Refill:  0    Order Specific Question:  Supervising Provider    Answer:  Jake Michaelis, DAVID C [[id number]]      Liam Graham, PA-C 11/01/13 1409

## 2013-11-01 NOTE — Discharge Instructions (Signed)
Antibiotic Nonuse  Your caregiver felt that the infection or problem was not one that would be helped with an antibiotic. Infections may be caused by viruses or bacteria. Only a caregiver can tell which one of these is the likely cause of an illness. A cold is the most common cause of infection in both adults and children. A cold is a virus. Antibiotic treatment will have no effect on a viral infection. Viruses can lead to many lost days of work caring for sick children and many missed days of school. Children may catch as many as 10 "colds" or "flus" per year during which they can be tearful, cranky, and uncomfortable. The goal of treating a virus is aimed at keeping the ill person comfortable. Antibiotics are medications used to help the body fight bacterial infections. There are relatively few types of bacteria that cause infections but there are hundreds of viruses. While both viruses and bacteria cause infection they are very different types of germs. A viral infection will typically go away by itself within 7 to 10 days. Bacterial infections may spread or get worse without antibiotic treatment. Examples of bacterial infections are:  Sore throats (like strep throat or tonsillitis).  Infection in the lung (pneumonia).  Ear and skin infections. Examples of viral infections are:  Colds or flus.  Most coughs and bronchitis.  Sore throats not caused by Strep.  Runny noses. It is often best not to take an antibiotic when a viral infection is the cause of the problem. Antibiotics can kill off the helpful bacteria that we have inside our body and allow harmful bacteria to start growing. Antibiotics can cause side effects such as allergies, nausea, and diarrhea without helping to improve the symptoms of the viral infection. Additionally, repeated uses of antibiotics can cause bacteria inside of our body to become resistant. That resistance can be passed onto harmful bacterial. The next time you have  an infection it may be harder to treat if antibiotics are used when they are not needed. Not treating with antibiotics allows our own immune system to develop and take care of infections more efficiently. Also, antibiotics will work better for [place] when they are prescribed for bacterial infections. Treatments for a child that is ill may include:  Give extra fluids throughout the day to stay hydrated.  Get plenty of rest.  Only give your child over-the-counter or prescription medicines for pain, discomfort, or fever as directed by your caregiver.  The use of a cool mist humidifier may help stuffy noses.  Cold medications if suggested by your caregiver. Your caregiver may decide to start you on an antibiotic if:  The problem you were seen for today continues for a longer length of time than expected.  You develop a secondary bacterial infection. SEEK MEDICAL CARE IF:  Fever lasts longer than 5 days.  Symptoms continue to get worse after 5 to 7 days or become severe.  Difficulty in breathing develops.  Signs of dehydration develop (poor drinking, rare urinating, dark colored urine).  Changes in behavior or worsening tiredness (listlessness or lethargy). Document Released: 08/14/2001 Document Revised: 08/28/2011 Document Reviewed: 02/10/2009 [facility] Patient Information 2014 [place], [place].  Bronchitis Bronchitis is inflammation of the airways that extend from the windpipe into the lungs (bronchi). The inflammation often causes mucus to develop, which leads to a cough. If the inflammation becomes severe, it may cause shortness of breath. CAUSES  Bronchitis may be caused by:   Viral infections.   Bacteria.  Cigarette smoke.   Allergens, pollutants, and other irritants.  SIGNS AND SYMPTOMS  The most common symptom of bronchitis is a frequent cough that produces mucus. Other symptoms include:  Fever.   Body aches.   Chest congestion.   Chills.   Shortness of  breath.   Sore throat.  DIAGNOSIS  Bronchitis is usually diagnosed through a medical history and physical exam. Tests, such as chest X-rays, are sometimes done to rule out other conditions.  TREATMENT  You may need to avoid contact with whatever caused the problem (smoking, for example). Medicines are sometimes needed. These may include:  Antibiotics. These may be prescribed if the condition is caused by bacteria.  Cough suppressants. These may be prescribed for relief of cough symptoms.   Inhaled medicines. These may be prescribed to help open your airways and make it easier for you to breathe.   Steroid medicines. These may be prescribed for those with recurrent (chronic) bronchitis. HOME CARE INSTRUCTIONS  Get plenty of rest.   Drink enough fluids to keep your urine clear or pale yellow (unless you have a medical condition that requires fluid restriction). Increasing fluids may help thin your secretions and will prevent dehydration.   Only take over-the-counter or prescription medicines as directed by your health care provider.  Only take antibiotics as directed. Make sure you finish them even if you start to feel better.  Avoid secondhand smoke, irritating chemicals, and strong fumes. These will make bronchitis worse. If you are a smoker, quit smoking. Consider using nicotine gum or skin patches to help control withdrawal symptoms. Quitting smoking will help your lungs heal faster.   Put a cool-mist humidifier in your bedroom at night to moisten the air. This may help loosen mucus. Change the water in the humidifier daily. You can also run the hot water in your shower and sit in the bathroom with the door closed for 5 10 minutes.   Follow up with your health care provider as directed.   Wash your hands frequently to avoid catching bronchitis again or spreading an infection to others.  SEEK MEDICAL CARE IF: Your symptoms do not improve after 1 week of treatment.  SEEK  IMMEDIATE MEDICAL CARE IF:  Your fever increases.  You have chills.   You have chest pain.   You have worsening shortness of breath.   You have bloody sputum.  You faint.  You have lightheadedness.  You have a severe headache.   You vomit repeatedly. MAKE SURE YOU:   Understand these instructions.  Will watch your condition.  Will get help right away if you are not doing well or get worse. Document Released: 06/05/2005 Document Revised: 03/26/2013 Document Reviewed: 01/28/2013 [facility] Patient Information 2014 [place].

## 2013-11-03 NOTE — ED Provider Notes (Signed)
Medical screening examination/treatment/procedure(s) were performed by non-physician practitioner and as supervising physician I was immediately available for consultation/collaboration.  Philipp Deputy, M.D.  Harden Mo, MD 11/03/13 [phone]

## 2014-03-19 ENCOUNTER — Emergency Department (HOSPITAL_COMMUNITY)
Admission: EM | Admit: 2014-03-19 | Discharge: 2014-03-19 | Disposition: A | Discharge status: Home / Self Care | Payer: BC Managed Care – PPO | Source: Home / Self Care | Attending: Emergency Medicine | Admitting: Emergency Medicine

## 2014-03-19 ENCOUNTER — Encounter (HOSPITAL_COMMUNITY): Payer: Self-pay | Admitting: Emergency Medicine

## 2014-03-19 DIAGNOSIS — J069 Acute upper respiratory infection, unspecified: Secondary | ICD-10-CM

## 2014-03-19 DIAGNOSIS — B9789 Other viral agents as the cause of diseases classified elsewhere: Principal | ICD-10-CM

## 2014-03-19 MED ORDER — HYDROCOD POLST-CHLORPHEN POLST 10-8 MG/5ML PO LQCR: 5.0000 mL | Freq: Two times a day (BID) | ORAL | Status: DC | PRN

## 2014-03-19 NOTE — ED Provider Notes (Signed)
CSN: [id number]     Arrival date & time 03/19/14  4268 History   First MD Initiated Contact with Patient 03/19/14 0911     Chief Complaint  Patient presents with  . URI   (Consider location/radiation/quality/duration/timing/severity/associated sxs/prior Treatment) HPI    43 year old female presents complaining of upper respiratory infection symptoms. She has cough, congestion, sore throat, headache. This started about 2 days ago. Her symptoms have been constant. Her cough is been bothering her a lot and has been causing some chest soreness with coughing. She has also had a couple episodes of posttussive vomiting. She is taking over-the-counter medications without relief. No fever, chills, shortness of breath, pleuritic chest pain. She has multiple sick contacts in her home with an identical illness.  Past Medical History  Diagnosis Date  . SVD (spontaneous vaginal delivery)     x 3   Past Surgical History  Procedure Laterality Date  . Right side lung surgery      portaion of right lower lobe removed per patient - pt was in her 20's - no problems ever after surgery  . Laparoscopic assisted vaginal hysterectomy N/A 09/11/2013    Procedure: LAPAROSCOPIC ASSISTED VAGINAL HYSTERECTOMY with bilateral salpingectomy and right labial biopsy with posterior fourchette;  Surgeon: Linda Hedges, DO;  Location: [facility];  Service: Gynecology;  Laterality: N/A;   History reviewed. No pertinent family history. History  Substance Use Topics  . Smoking status: Current Every Day Smoker -- 1.00 packs/day for 22 years  . Smokeless tobacco: Never Used  . Alcohol Use: 18.0 oz/week    30 Cans of beer per week     Comment: social - beer   OB History   Grav Para Term Preterm Abortions TAB SAB Ect Mult Living                 Review of Systems  HENT: Positive for congestion, sinus pressure and sore throat. Negative for ear pain, rhinorrhea and sneezing.   Respiratory: Positive for cough and chest tightness.  Negative for shortness of breath.   Cardiovascular: Negative for chest pain.  Gastrointestinal: Positive for vomiting. Negative for nausea and diarrhea.  All other systems reviewed and are negative.   Allergies  Review of patient's allergies indicates no known allergies.  Home Medications   Prior to Admission medications   Medication Sig Start Date End Date Taking? Authorizing Provider  acetaminophen (TYLENOL) 500 MG tablet Take 1,000 mg by mouth every 6 (six) hours as needed for moderate pain or headache.    Historical Provider, MD  albuterol (PROVENTIL HFA;VENTOLIN HFA) 108 (90 BASE) MCG/ACT inhaler Inhale 2 puffs into the lungs every 4 (four) hours as needed for wheezing. 11/01/13   Liam Graham, PA-C  chlorpheniramine-HYDROcodone (TUSSIONEX PENNKINETIC ER) 10-8 MG/5ML LQCR Take 5 mLs by mouth every 12 (twelve) hours as needed for cough. 11/01/13   Liam Graham, PA-C  chlorpheniramine-HYDROcodone (TUSSIONEX PENNKINETIC ER) 10-8 MG/5ML LQCR Take 5 mLs by mouth every 12 (twelve) hours as needed for cough. 03/19/14   Liam Graham, PA-C  ibuprofen (ADVIL,MOTRIN) 800 MG tablet Take 1 tablet (800 mg total) by mouth every 8 (eight) hours as needed. 09/12/13   Megan Morris, DO  oxyCODONE-acetaminophen (PERCOCET/ROXICET) 5-325 MG per tablet Take 1-2 tablets by mouth every 4 (four) hours as needed for severe pain (moderate to severe pain (when tolerating fluids)). 09/12/13   Megan Morris, DO  predniSONE (STERAPRED UNI-PAK) 5 MG TABS tablet Use as directed on package instructions 11/01/13  Liam Graham, PA-C  Spacer/Aero-Holding Chambers (AEROCHAMBER PLUS FLO-VU LARGE) MISC 1 each by Other route once. 11/01/13   Freeman Caldron Sheneka Schrom, PA-C   BP 122/70  Pulse 82  Temp(Src) 98.6 F (37 C) (Oral)  Resp 18  SpO2 100%  LMP 08/19/2013 Physical Exam  Nursing note and vitals reviewed. Constitutional: She is oriented to person, place, and time. Vital signs are normal. She appears well-developed and  well-nourished. No distress.  HENT:  Head: Normocephalic and atraumatic.  Right Ear: Tympanic membrane, external ear and ear canal normal.  Left Ear: Tympanic membrane, external ear and ear canal normal.  Nose: Nose normal. Right sinus exhibits no maxillary sinus tenderness and no frontal sinus tenderness. Left sinus exhibits no maxillary sinus tenderness and no frontal sinus tenderness.  Mouth/Throat: Uvula is midline, oropharynx is clear and moist and mucous membranes are normal. No oropharyngeal exudate or posterior oropharyngeal erythema.  Eyes: Conjunctivae are normal. Right eye exhibits no discharge. Left eye exhibits no discharge.  Neck: Normal range of motion. Neck supple.  Cardiovascular: Normal rate, regular rhythm and normal heart sounds.   Pulmonary/Chest: Effort normal and breath sounds normal. No respiratory distress.  Lymphadenopathy:    She has no cervical adenopathy.  Neurological: She is alert and oriented to person, place, and time. She has normal strength. Coordination normal.  Skin: Skin is warm and dry. No rash noted. She is not diaphoretic.  Psychiatric: She has a normal mood and affect. Judgment normal.    ED Course  Procedures (including critical care time) Labs Review Labs Reviewed - No data to display  Imaging Review No results found.   MDM   1. Viral URI with cough    Vitals are normal. Most likely viral URI. Treat symptomatically. Followup if worsening.   Meds ordered this encounter  Medications  . chlorpheniramine-HYDROcodone (TUSSIONEX PENNKINETIC ER) 10-8 MG/5ML LQCR    Sig: Take 5 mLs by mouth every 12 (twelve) hours as needed for cough.    Dispense:  115 mL    Refill:  0    Order Specific Question:  Supervising Provider    Answer:  Jake Michaelis, DAVID C Royal, PA-C 03/19/14 1040

## 2014-03-19 NOTE — ED Notes (Signed)
Pt       Reports      Symptoms  Of    Cough   And  Congested  With  Symptoms        X   sev     Days                 Pt   Ambulated  To  Room  With  A   Slow  Steady  Gait                    Has  Had   Sinus  Congestion  As  Well        Has  Been  Taking  otc  meds     For  Her  Symptoms

## 2014-03-19 NOTE — ED Provider Notes (Signed)
Medical screening examination/treatment/procedure(s) were performed by non-physician practitioner and as supervising physician I was immediately available for consultation/collaboration.  Chrystina Naff, M.D.   Lunden Mcleish C Airlie Blumenberg, MD 03/19/14 2220 

## 2014-03-19 NOTE — Discharge Instructions (Signed)
Upper Respiratory Infection, Adult An upper respiratory infection (URI) is also sometimes known as the common cold. The upper respiratory tract includes the nose, sinuses, throat, trachea, and bronchi. Bronchi are the airways leading to the lungs. Most people improve within 1 week, but symptoms can last up to 2 weeks. A residual cough may last even longer.  CAUSES Many different viruses can infect the tissues lining the upper respiratory tract. The tissues become irritated and inflamed and often become very moist. Mucus production is also common. A cold is contagious. You can easily spread the virus to others by oral contact. This includes kissing, sharing a glass, coughing, or sneezing. Touching your mouth or nose and then touching a surface, which is then touched by another person, can also spread the virus. SYMPTOMS  Symptoms typically develop 1 to 3 days after you come in contact with a cold virus. Symptoms vary from person to person. They may include:  Runny nose.  Sneezing.  Nasal congestion.  Sinus irritation.  Sore throat.  Loss of voice (laryngitis).  Cough.  Fatigue.  Muscle aches.  Loss of appetite.  Headache.  Low-grade fever. DIAGNOSIS  You might diagnose your own cold based on familiar symptoms, since most people get a cold 2 to 3 times a year. Your caregiver can confirm this based on your exam. Most importantly, your caregiver can check that your symptoms are not due to another disease such as strep throat, sinusitis, pneumonia, asthma, or epiglottitis. Blood tests, throat tests, and X-rays are not necessary to diagnose a common cold, but they may sometimes be helpful in excluding other more serious diseases. Your caregiver will decide if any further tests are required. RISKS AND COMPLICATIONS  You may be at risk for a more severe case of the common cold if you smoke cigarettes, have chronic heart disease (such as heart failure) or lung disease (such as asthma), or if  you have a weakened immune system. The very young and very old are also at risk for more serious infections. Bacterial sinusitis, middle ear infections, and bacterial pneumonia can complicate the common cold. The common cold can worsen asthma and chronic obstructive pulmonary disease (COPD). Sometimes, these complications can require emergency medical care and may be life-threatening. PREVENTION  The best way to protect against getting a cold is to practice good hygiene. Avoid oral or hand contact with people with cold symptoms. Wash your hands often if contact occurs. There is no clear evidence that vitamin C, vitamin E, echinacea, or exercise reduces the chance of developing a cold. However, it is always recommended to get plenty of rest and practice good nutrition. TREATMENT  Treatment is directed at relieving symptoms. There is no cure. Antibiotics are not effective, because the infection is caused by a virus, not by bacteria. Treatment may include:  Increased fluid intake. Sports drinks offer valuable electrolytes, sugars, and fluids.  Breathing heated mist or steam (vaporizer or shower).  Eating chicken soup or other clear broths, and maintaining good nutrition.  Getting plenty of rest.  Using gargles or lozenges for comfort.  Controlling fevers with ibuprofen or acetaminophen as directed by your caregiver.  Increasing usage of your inhaler if you have asthma. Zinc gel and zinc lozenges, taken in the first 24 hours of the common cold, can shorten the duration and lessen the severity of symptoms. Pain medicines may help with fever, muscle aches, and throat pain. A variety of non-prescription medicines are available to treat congestion and runny nose. Your caregiver   can make recommendations and may suggest nasal or lung inhalers for other symptoms.  HOME CARE INSTRUCTIONS   Only take over-the-counter or prescription medicines for pain, discomfort, or fever as directed by your  caregiver.  Use a warm mist humidifier or inhale steam from a shower to increase air moisture. This may keep secretions moist and make it easier to breathe.  Drink enough water and fluids to keep your urine clear or pale yellow.  Rest as needed.  Return to work when your temperature has returned to normal or as your caregiver advises. You may need to stay home longer to avoid infecting others. You can also use a face mask and careful hand washing to prevent spread of the virus. SEEK MEDICAL CARE IF:   After the first few days, you feel you are getting worse rather than better.  You need your caregiver's advice about medicines to control symptoms.  You develop chills, worsening shortness of breath, or brown or red sputum. These may be signs of pneumonia.  You develop yellow or brown nasal discharge or pain in the face, especially when you bend forward. These may be signs of sinusitis.  You develop a fever, swollen neck glands, pain with swallowing, or white areas in the back of your throat. These may be signs of strep throat. SEEK IMMEDIATE MEDICAL CARE IF:   You have a fever.  You develop severe or persistent headache, ear pain, sinus pain, or chest pain.  You develop wheezing, a prolonged cough, cough up blood, or have a change in your usual mucus (if you have chronic lung disease).  You develop sore muscles or a stiff neck. Document Released: 11/29/2000 Document Revised: 08/28/2011 Document Reviewed: 09/10/2013 [employer] Patient Information 2015 [employer]. This information is not intended to replace advice given to you by your health care provider. Make sure you discuss any questions you have with your health care provider.  

## 2014-07-14 ENCOUNTER — Other Ambulatory Visit: Payer: Self-pay | Admitting: Physician Assistant

## 2014-07-14 DIAGNOSIS — Z803 Family history of malignant neoplasm of breast: Secondary | ICD-10-CM

## 2014-07-14 DIAGNOSIS — Z1231 Encounter for screening mammogram for malignant neoplasm of breast: Secondary | ICD-10-CM

## 2014-07-23 ENCOUNTER — Ambulatory Visit
Admission: RE | Admit: 2014-07-23 | Discharge: 2014-07-23 | Disposition: A | Discharge status: Home / Self Care | Payer: BLUE CROSS/BLUE SHIELD | Source: Ambulatory Visit | Attending: Physician Assistant | Admitting: Physician Assistant

## 2014-07-23 DIAGNOSIS — Z1231 Encounter for screening mammogram for malignant neoplasm of breast: Secondary | ICD-10-CM

## 2014-07-23 DIAGNOSIS — Z803 Family history of malignant neoplasm of breast: Secondary | ICD-10-CM

## 2014-07-28 ENCOUNTER — Encounter: Payer: Self-pay | Admitting: Diagnostic Neuroimaging

## 2014-07-28 ENCOUNTER — Ambulatory Visit (INDEPENDENT_AMBULATORY_CARE_PROVIDER_SITE_OTHER): Payer: BLUE CROSS/BLUE SHIELD | Admitting: Diagnostic Neuroimaging

## 2014-07-28 VITALS — BP 130/88 | HR 84 | Ht 64.0 in | Wt 121.6 lb

## 2014-07-28 DIAGNOSIS — F1010 Alcohol abuse, uncomplicated: Secondary | ICD-10-CM

## 2014-07-28 DIAGNOSIS — H53001 Unspecified amblyopia, right eye: Secondary | ICD-10-CM

## 2014-07-28 DIAGNOSIS — G5621 Lesion of ulnar nerve, right upper limb: Secondary | ICD-10-CM

## 2014-07-28 DIAGNOSIS — G43009 Migraine without aura, not intractable, without status migrainosus: Secondary | ICD-10-CM

## 2014-07-28 DIAGNOSIS — H53009 Unspecified amblyopia, unspecified eye: Secondary | ICD-10-CM | POA: Insufficient documentation

## 2014-07-28 DIAGNOSIS — R252 Cramp and spasm: Secondary | ICD-10-CM

## 2014-07-28 DIAGNOSIS — Z720 Tobacco use: Secondary | ICD-10-CM | POA: Insufficient documentation

## 2014-07-28 DIAGNOSIS — F411 Generalized anxiety disorder: Secondary | ICD-10-CM

## 2014-07-28 NOTE — Patient Instructions (Signed)
I will check additional testing. 

## 2014-07-28 NOTE — Progress Notes (Signed)
[facility]  PATIENT: Melanie Powell DOB: [birth year]  REFERRING CLINICIAN: Long HISTORY FROM: patient and husband REASON FOR VISIT: new consult    HISTORICAL  CHIEF COMPLAINT:  Chief Complaint  Patient presents with  . New Evaluation    i was sent here by my PCP and i have migraines and right hand cramping    HISTORY OF PRESENT ILLNESS:   44 year old right-handed female with migraine, hyperglycemia, anxiety, here for evaluation of right hand cramping and headaches.  For past 5-10 years patient has had intermittent cramps in the right hand, hypothenar region, especially with exertion. Patient's muscle cramps up for 1 minute at a time, occurring a few times per day.   No numbness or tingling in the fingers or hands. No problems with legs.  Patient has history of epidural associated headache after last childbirth 20 years ago. She had headache for 3 months, which resolved after blood patch. Patient also has migraine headaches nowadays, severe throbbing global headaches with nausea, vomiting, photophobia, phonophobia, 2 days per month.  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  Patient also struggles with daily alcohol abuse. She drinks 7 beers per day, and has been doing this for past 10-20 years. Patient feels that she drinks heavily due to stress, mainly related to raising her autistic child who is 1 years old. She struggles with insomnia and anxiety symptoms. She's never been officially evaluated or treated.    REVIEW OF SYSTEMS: Full 14 system review of systems performed and notable only for sleepiness headache anxiety decreased energy allergies cramps joint pain feeling cold weight loss fatigue.  ALLERGIES: No Known Allergies   HOME MEDICATIONS: Outpatient Prescriptions Prior to Visit  Medication Sig Dispense Refill  . acetaminophen (TYLENOL) 500 MG tablet Take 1,000 mg by mouth every 6 (six) hours  as needed for moderate pain or headache.    . ibuprofen (ADVIL,MOTRIN) 800 MG tablet Take 1 tablet (800 mg total) by mouth every 8 (eight) hours as needed. 30 tablet 1  . albuterol (PROVENTIL HFA;VENTOLIN HFA) 108 (90 BASE) MCG/ACT inhaler Inhale 2 puffs into the lungs every 4 (four) hours as needed for wheezing. 1 Inhaler 0  . chlorpheniramine-HYDROcodone (TUSSIONEX PENNKINETIC ER) 10-8 MG/5ML LQCR Take 5 mLs by mouth every 12 (twelve) hours as needed for cough. 115 mL 0  . chlorpheniramine-HYDROcodone (TUSSIONEX PENNKINETIC ER) 10-8 MG/5ML LQCR Take 5 mLs by mouth every 12 (twelve) hours as needed for cough. 115 mL 0  . oxyCODONE-acetaminophen (PERCOCET/ROXICET) 5-325 MG per tablet Take 1-2 tablets by mouth every 4 (four) hours as needed for severe pain (moderate to severe pain (when tolerating fluids)). 50 tablet 0  . predniSONE (STERAPRED UNI-PAK) 5 MG TABS tablet Use as directed on package instructions 21 tablet 0  . Spacer/Aero-Holding Chambers (AEROCHAMBER PLUS FLO-VU LARGE) MISC 1 each by Other route once. 1 each 0   No facility-administered medications prior to visit.    PAST MEDICAL HISTORY: Past Medical History  Diagnosis Date  . SVD (spontaneous vaginal delivery)     x 4  . Migraine   . Hypercholesteremia   . Anxiety     PAST SURGICAL HISTORY: Past Surgical History  Procedure Laterality Date  . Right side lung surgery      portaion of right lower lobe removed per patient - pt was in her 20's - no problems ever after surgery  . Laparoscopic assisted vaginal hysterectomy N/A 09/11/2013  Procedure: LAPAROSCOPIC ASSISTED VAGINAL HYSTERECTOMY with bilateral salpingectomy and right labial biopsy with posterior fourchette;  Surgeon: Linda Hedges, DO;  Location: [facility];  Service: Gynecology;  Laterality: N/A;    FAMILY HISTORY: Family History  Problem Relation Age of Onset  . Brain cancer Mother   . Heart failure Father   . Parkinson's disease Father   . Parkinson's disease  Brother   . Parkinson's disease Paternal Grandmother     SOCIAL HISTORY:  History   Social History  . Marital Status: Married    Spouse Name: [place]    Number of Children: 4  . Years of Education: 12   Occupational History  . Home maker    Social History Main Topics  . Smoking status: Current Every Day Smoker -- 1.00 packs/day for 22 years  . Smokeless tobacco: Never Used  . Alcohol Use: 29.4 oz/week    49 Cans of beer per week     Comment: social - beer  . Drug Use: No  . Sexual Activity: Yes    Birth Control/ Protection: None     Comment: husband - vasectomy   Other Topics Concern  . Not on file   Social History Narrative   Lives at home    Has four children and one grandchild   Is a homemaker        PHYSICAL EXAM  Filed Vitals:   07/28/14 1043  BP: 130/88  Pulse: 84  Height: 5\' 4"  (1.626 m)  Weight: 121 lb 9.6 oz (55.157 kg)    Body mass index is 20.86 kg/(m^2).   Visual Acuity Screening   Right eye Left eye Both eyes  Without correction: unable 20/50   With correction:       No flowsheet data found.  GENERAL EXAM: Patient is in no distress; well developed, nourished and groomed; neck is supple  CARDIOVASCULAR: Regular rate and rhythm, no murmurs, no carotid bruits  NEUROLOGIC: MENTAL STATUS: awake, alert, oriented to person, place and time, recent and remote memory intact, normal attention and concentration, language fluent, comprehension intact, naming intact, fund of knowledge appropriate CRANIAL NERVE: no papilledema on fundoscopic exam, pupils equal and reactive to light, visual fields full to confrontation IN LEFT EYE; RIGHT ABLE TO COUNT SOME FINGERS BUT NOT ACCURATELY WITH DECR LIGHT SENS, extraocular muscles --> RIGHT EYE ADDUCTED WITH PRIMARY GAZE, WITH DECR ABDUCTION ON RIGHT GAZE; DECR LIGHT SENS IN RIGHT EYE. NYSTAGMUS ON LEFT AND RIGHT GAZE; facial sensation and strength symmetric, hearing intact, palate elevates symmetrically, uvula  midline, shoulder shrug symmetric, tongue midline. MOTOR: normal bulk and tone, POSTURAL TREMOR IN BUE; full strength in the BUE, BLE; EXCEPT WEAKNESS OF RIGHT HAND FINDER ABDUCTION SENSORY: normal and symmetric to light touch, temperature, vibration COORDINATION: finger-nose-finger, fine finger movements normal REFLEXES: deep tendon reflexes present and symmetric GAIT/STATION: narrow based gait; able to walk tandem; romberg is negative    DIAGNOSTIC DATA (LABS, IMAGING, TESTING) - I reviewed patient records, labs, notes, testing and imaging myself where available.  Lab Results  Component Value Date   WBC 13.9* 09/12/2013   HGB 10.2* 09/12/2013   HCT 31.2* 09/12/2013   MCV 95.1 09/12/2013   PLT 200 09/12/2013      Component Value Date/Time   NA 141 09/12/2013 0524   K 4.4 09/12/2013 0524   CL 105 09/12/2013 0524   CO2 28 09/12/2013 0524   GLUCOSE 105* 09/12/2013 0524   BUN 7 09/12/2013 0524   CREATININE 0.75 09/12/2013 0524  CALCIUM 8.4 09/12/2013 0524   PROT 5.2* 09/12/2013 0524   ALBUMIN 2.7* 09/12/2013 0524   AST 13 09/12/2013 0524   ALT 12 09/12/2013 0524   ALKPHOS 51 09/12/2013 0524   BILITOT <0.2* 09/12/2013 0524   GFRNONAA >90 09/12/2013 0524   GFRAA >90 09/12/2013 0524   No results found for: CHOL, HDL, LDLCALC, LDLDIRECT, TRIG, CHOLHDL No results found for: HGBA1C No results found for: VITAMINB12 No results found for: TSH     ASSESSMENT AND PLAN  44 y.o. 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 Also has intermittent migraine, 2 per month, stable. Also with right eye amblyopia since childhood. Also with chronic alcohol abuse, anxiety insomnia.   PLAN: - MRI brain (headache eval) - EMG/NCS for right ulnar neuropathy  Orders Placed This Encounter  Procedures  . MR Brain Wo Contrast  . NCV with EMG(electromyography)   Return for for NCV/EMG.    Penni Bombard, MD  02/21/767, 08:81 PM Certified in Neurology, Neurophysiology and Neuroimaging  [facility] [address] [place], [place] [address] [phone]

## 2014-08-06 ENCOUNTER — Other Ambulatory Visit: Payer: BLUE CROSS/BLUE SHIELD

## 2014-08-19 ENCOUNTER — Ambulatory Visit (INDEPENDENT_AMBULATORY_CARE_PROVIDER_SITE_OTHER): Payer: BLUE CROSS/BLUE SHIELD

## 2014-08-19 DIAGNOSIS — F1010 Alcohol abuse, uncomplicated: Secondary | ICD-10-CM | POA: Diagnosis not present

## 2014-08-19 DIAGNOSIS — G43009 Migraine without aura, not intractable, without status migrainosus: Secondary | ICD-10-CM

## 2014-08-19 DIAGNOSIS — Z720 Tobacco use: Secondary | ICD-10-CM | POA: Diagnosis not present

## 2014-08-19 DIAGNOSIS — R252 Cramp and spasm: Secondary | ICD-10-CM | POA: Diagnosis not present

## 2014-08-19 DIAGNOSIS — H53001 Unspecified amblyopia, right eye: Secondary | ICD-10-CM

## 2014-08-19 DIAGNOSIS — F411 Generalized anxiety disorder: Secondary | ICD-10-CM | POA: Diagnosis not present

## 2014-08-19 DIAGNOSIS — G5621 Lesion of ulnar nerve, right upper limb: Secondary | ICD-10-CM

## 2014-09-03 ENCOUNTER — Encounter: Payer: BLUE CROSS/BLUE SHIELD | Admitting: Radiology

## 2014-09-03 ENCOUNTER — Encounter: Payer: BLUE CROSS/BLUE SHIELD | Admitting: Diagnostic Neuroimaging

## 2014-10-01 ENCOUNTER — Ambulatory Visit (INDEPENDENT_AMBULATORY_CARE_PROVIDER_SITE_OTHER): Payer: BLUE CROSS/BLUE SHIELD | Admitting: Diagnostic Neuroimaging

## 2014-10-01 ENCOUNTER — Encounter (INDEPENDENT_AMBULATORY_CARE_PROVIDER_SITE_OTHER): Payer: Self-pay | Admitting: Diagnostic Neuroimaging

## 2014-10-01 DIAGNOSIS — Z720 Tobacco use: Secondary | ICD-10-CM

## 2014-10-01 DIAGNOSIS — G5621 Lesion of ulnar nerve, right upper limb: Secondary | ICD-10-CM

## 2014-10-01 DIAGNOSIS — F1010 Alcohol abuse, uncomplicated: Secondary | ICD-10-CM

## 2014-10-01 DIAGNOSIS — Z0289 Encounter for other administrative examinations: Secondary | ICD-10-CM

## 2014-10-01 DIAGNOSIS — F411 Generalized anxiety disorder: Secondary | ICD-10-CM

## 2014-10-01 DIAGNOSIS — R252 Cramp and spasm: Secondary | ICD-10-CM

## 2014-10-01 DIAGNOSIS — H53001 Unspecified amblyopia, right eye: Secondary | ICD-10-CM

## 2014-10-01 DIAGNOSIS — G43009 Migraine without aura, not intractable, without status migrainosus: Secondary | ICD-10-CM

## 2014-10-01 NOTE — Procedures (Signed)
   [facility]  NCS (NERVE CONDUCTION STUDY) WITH EMG (ELECTROMYOGRAPHY) REPORT   STUDY DATE: 10/01/14 PATIENT NAME: Melanie Powell DOB: [birth year] MRN: [id number]  ORDERING CLINICIAN: Andrey Spearman, MD   TECHNOLOGIST: Laretta Alstrom  ELECTROMYOGRAPHER: Earlean Polka. Virdia Ziesmer, MD  CLINICAL INFORMATION: 44 year old female with right hand muscle cramp and pain.  FINDINGS: NERVE CONDUCTION STUDY: Bilateral median and ulnar motor responses and F wave latencies are normal. Bilateral median and ulnar sensory responses are normal.  NEEDLE ELECTROMYOGRAPHY: Needle examination of right upper extremity deltoid, biceps, triceps, flexor carpi radialis, first dorsal interosseous and right C6-7, C7-T1 paraspinal muscles are normal.  IMPRESSION:  This is a normal study. No evidence of large fiber neuropathy or cervical radiculopathy at this time.   INTERPRETING PHYSICIAN:  Penni Bombard, MD Certified in Neurology, Neurophysiology and Neuroimaging  [facility] [address] [place], [place] [address] [phone]

## 2015-05-18 ENCOUNTER — Emergency Department (HOSPITAL_COMMUNITY)
Admission: EM | Admit: 2015-05-18 | Discharge: 2015-05-18 | Disposition: A | Discharge status: Home / Self Care | Payer: BLUE CROSS/BLUE SHIELD | Source: Home / Self Care

## 2015-05-18 ENCOUNTER — Encounter (HOSPITAL_COMMUNITY): Payer: Self-pay | Admitting: Emergency Medicine

## 2015-05-18 DIAGNOSIS — J40 Bronchitis, not specified as acute or chronic: Secondary | ICD-10-CM

## 2015-05-18 MED ORDER — HYDROCOD POLST-CPM POLST ER 10-8 MG/5ML PO SUER: 5.0000 mL | Freq: Every evening | ORAL | Status: DC | PRN

## 2015-05-18 MED ORDER — AZITHROMYCIN 250 MG PO TABS: 250.0000 mg | ORAL_TABLET | Freq: Every day | ORAL | Status: DC

## 2015-05-18 MED ORDER — ALBUTEROL SULFATE HFA 108 (90 BASE) MCG/ACT IN AERS: 2.0000 | INHALATION_SPRAY | RESPIRATORY_TRACT | Status: DC | PRN

## 2015-05-18 MED ORDER — IPRATROPIUM-ALBUTEROL 0.5-2.5 (3) MG/3ML IN SOLN
3.0000 mL | Freq: Once | RESPIRATORY_TRACT | Status: AC
Start: 2015-05-18 — End: 2015-05-18
  Administered 2015-05-18: 3 mL via RESPIRATORY_TRACT

## 2015-05-18 MED ORDER — IPRATROPIUM-ALBUTEROL 0.5-2.5 (3) MG/3ML IN SOLN
RESPIRATORY_TRACT | Status: AC
Start: 2015-05-18 — End: 2015-05-18
  Filled 2015-05-18: qty 3

## 2015-05-18 NOTE — ED Provider Notes (Signed)
CSN: [id number]     Arrival date & time 05/18/15  1309 History   None    Chief Complaint  Patient presents with  . URI   (Consider location/radiation/quality/duration/timing/severity/associated sxs/prior Treatment) HPI History obtained from patient:   LOCATION:chest SEVERITY:3 DURATION:over 1 week CONTEXT:sudden onset QUALITY:dry non productive cough MODIFYING FACTORS:OTC meds without relief ASSOCIATED SYMPTOMS:low grade temp, sweats TIMING:constant OCCUPATION:   Past Medical History  Diagnosis Date  . SVD (spontaneous vaginal delivery)     x 4  . Migraine   . Hypercholesteremia   . Anxiety    Past Surgical History  Procedure Laterality Date  . Right side lung surgery      portaion of right lower lobe removed per patient - pt was in her 20's - no problems ever after surgery  . Laparoscopic assisted vaginal hysterectomy N/A 09/11/2013    Procedure: LAPAROSCOPIC ASSISTED VAGINAL HYSTERECTOMY with bilateral salpingectomy and right labial biopsy with posterior fourchette;  Surgeon: Linda Hedges, DO;  Location: [facility];  Service: Gynecology;  Laterality: N/A;   Family History  Problem Relation Age of Onset  . Brain cancer Mother   . Heart failure Father   . Parkinson's disease Father   . Parkinson's disease Brother   . Parkinson's disease Paternal Grandmother    Social History  Substance Use Topics  . Smoking status: Current Every Day Smoker -- 1.00 packs/day for 22 years  . Smokeless tobacco: Never Used  . Alcohol Use: 29.4 oz/week    49 Cans of beer per week     Comment: social - beer   OB History    No data available     Review of Systems ROS +'ve cough, chest tightness  Denies: HEADACHE, NAUSEA, ABDOMINAL PAIN, CHEST PAIN, CONGESTION, DYSURIA, SHORTNESS OF BREATH  Allergies  Review of patient's allergies indicates no known allergies.  Home Medications   Prior to Admission medications   Medication Sig Start Date End Date Taking? Authorizing Provider   acetaminophen (TYLENOL) 500 MG tablet Take 1,000 mg by mouth every 6 (six) hours as needed for moderate pain or headache.    Historical Provider, MD  atorvastatin (LIPITOR) 20 MG tablet Take 20 mg by mouth daily. 07/18/14   Historical Provider, MD  ferrous sulfate 325 (65 FE) MG tablet Take 325 mg by mouth daily with breakfast.    Historical Provider, MD  ibuprofen (ADVIL,MOTRIN) 800 MG tablet Take 1 tablet (800 mg total) by mouth every 8 (eight) hours as needed. 09/12/13   Linda Hedges, DO  Multiple Vitamin (MULTIVITAMIN WITH MINERALS) TABS tablet Take 1 tablet by mouth daily.    Historical Provider, MD  traMADol (ULTRAM) 50 MG tablet Take 50 mg by mouth. 07/18/14   Historical Provider, MD  Vitamin D, Ergocalciferol, (DRISDOL) 50000 UNITS CAPS capsule Take 1 capsule by mouth once a week. 07/15/14   Historical Provider, MD   Meds Ordered and Administered this Visit   Medications  ipratropium-albuterol (DUONEB) 0.5-2.5 (3) MG/3ML nebulizer solution 3 mL (not administered)    BP 151/98 mmHg  Pulse 102  Temp(Src) 98.3 F (36.8 C) (Oral)  Resp 22  SpO2 100%  LMP 08/19/2013 No data found.   Physical Exam  Constitutional: She is oriented to person, place, and time. She appears well-developed and well-nourished.  HENT:  Head: Normocephalic and atraumatic.  Left Ear: External ear normal.  Mouth/Throat: Oropharynx is clear and moist.  Eyes: Conjunctivae are normal.  Neck: Normal range of motion. Neck supple.  Cardiovascular: Normal rate and regular  rhythm.   Pulmonary/Chest: Effort normal. No respiratory distress. She has wheezes.  Musculoskeletal: Normal range of motion.  Neurological: She is alert and oriented to person, place, and time.  Skin: Skin is warm and dry.  Nursing note and vitals reviewed.   ED Course  Procedures (including critical care time)  Labs Review Labs Reviewed - No data to display  Imaging Review No results found.   Visual Acuity Review  Right Eye  Distance:   Left Eye Distance:   Bilateral Distance:    Right Eye Near:   Left Eye Near:    Bilateral Near:         MDM   1. Bronchitis    Duo neb helped chest tightness.  Rx for albuterol, tussionex, zpak Follow up if there are new or worsening of symptoms    Konrad Felix, [place] 05/18/15 1457

## 2015-05-18 NOTE — ED Notes (Signed)
Notified patient and family of delay secondary to nurse verifying instructions

## 2015-05-18 NOTE — ED Notes (Signed)
Symptoms onset around 11/24.  Denies productive cough or having runny nose or sneezing.  Patient has not checked temperature.  Patient complains of cough and feeling so bad, cough is worsening and otc medicines have not helped

## 2015-05-18 NOTE — Discharge Instructions (Signed)
Upper Respiratory Infection, Adult Most upper respiratory infections (URIs) are caused by a virus. A URI affects the nose, throat, and upper air passages. The most common type of URI is often called "the common cold." HOME CARE   Take medicines only as told by your doctor.  Gargle warm saltwater or take cough drops to comfort your throat as told by your doctor.  Use a warm mist humidifier or inhale steam from a shower to increase air moisture. This may make it easier to breathe.  Drink enough fluid to keep your pee (urine) clear or pale yellow.  Eat soups and other clear broths.  Have a healthy diet.  Rest as needed.  Go back to work when your fever is gone or your doctor says it is okay.  You may need to stay home longer to avoid giving your URI to others.  You can also wear a face mask and wash your hands often to prevent spread of the virus.  Use your inhaler more if you have asthma.  Do not use any tobacco products, including cigarettes, chewing tobacco, or electronic cigarettes. If you need help quitting, ask your doctor. GET HELP IF:  You are getting worse, not better.  Your symptoms are not helped by medicine.  You have chills.  You are getting more short of breath.  You have brown or red mucus.  You have yellow or brown discharge from your nose.  You have pain in your face, especially when you bend forward.  You have a fever.  You have puffy (swollen) neck glands.  You have pain while swallowing.  You have white areas in the back of your throat. GET HELP RIGHT AWAY IF:   You have very bad or constant:  Headache.  Ear pain.  Pain in your forehead, behind your eyes, and over your cheekbones (sinus pain).  Chest pain.  You have long-lasting (chronic) lung disease and any of the following:  Wheezing.  Long-lasting cough.  Coughing up blood.  A change in your usual mucus.  You have a stiff neck.  You have changes in  your:  Vision.  Hearing.  Thinking.  Mood. MAKE SURE YOU:   Understand these instructions.  Will watch your condition.  Will get help right away if you are not doing well or get worse.   This information is not intended to replace advice given to you by your health care provider. Make sure you discuss any questions you have with your health care provider.   Document Released: 11/22/2007 Document Revised: 10/20/2014 Document Reviewed: 09/10/2013 Elsevier Interactive Patient Education 2016 [employer].  How to Use an Inhaler Using your inhaler correctly is very important. Good technique will make sure that the medicine reaches your lungs.  HOW TO USE AN INHALER:  Take the cap off the inhaler.  If this is the first time using your inhaler, you need to prime it. Shake the inhaler for 5 seconds. Release four puffs into the air, away from your face. Ask your doctor for help if you have questions.  Shake the inhaler for 5 seconds.  Turn the inhaler so the bottle is above the mouthpiece.  Put your pointer finger on top of the bottle. Your thumb holds the bottom of the inhaler.  Open your mouth.  Either hold the inhaler away from your mouth (the width of 2 fingers) or place your lips tightly around the mouthpiece. Ask your doctor which way to use your inhaler.  Breathe out as  much air as possible.  Breathe in and push down on the bottle 1 time to release the medicine. You will feel the medicine go in your mouth and throat.  Continue to take a deep breath in very slowly. Try to fill your lungs.  After you have breathed in completely, hold your breath for 10 seconds. This will help the medicine to settle in your lungs. If you cannot hold your breath for 10 seconds, hold it for as long as you can before you breathe out.  Breathe out slowly, through pursed lips. Whistling is an example of pursed lips.  If your doctor has told you to take more than 1 puff, wait at least 15-30  seconds between puffs. This will help you get the best results from your medicine. Do not use the inhaler more than your doctor tells you to.  Put the cap back on the inhaler.  Follow the directions from your doctor or from the inhaler package about cleaning the inhaler. If you use more than one inhaler, ask your doctor which inhalers to use and what order to use them in. Ask your doctor to help you figure out when you will need to refill your inhaler.  If you use a steroid inhaler, always rinse your mouth with water after your last puff, gargle and spit out the water. Do not swallow the water. GET HELP IF:  The inhaler medicine only partially helps to stop wheezing or shortness of breath.  You are having trouble using your inhaler.  You have some increase in thick spit (phlegm). GET HELP RIGHT AWAY IF:  The inhaler medicine does not help your wheezing or shortness of breath or you have tightness in your chest.  You have dizziness, headaches, or fast heart rate.  You have chills, fever, or night sweats.  You have a large increase of thick spit, or your thick spit is bloody. MAKE SURE YOU:   Understand these instructions.  Will watch your condition.  Will get help right away if you are not doing well or get worse.   This information is not intended to replace advice given to you by your health care provider. Make sure you discuss any questions you have with your health care provider.   Document Released: 03/14/2008 Document Revised: 03/26/2013 Document Reviewed: 01/02/2013 Elsevier Interactive Patient Education 2016 Elsevier Inc. Acute Bronchitis Bronchitis is when the airways that extend from the windpipe into the lungs get red, puffy, and painful (inflamed). Bronchitis often causes thick spit (mucus) to develop. This leads to a cough. A cough is the most common symptom of bronchitis. In acute bronchitis, the condition usually begins suddenly and goes away over time (usually in  2 weeks). Smoking, allergies, and asthma can make bronchitis worse. Repeated episodes of bronchitis may cause more lung problems. HOME CARE  Rest.  Drink enough fluids to keep your pee (urine) clear or pale yellow (unless you need to limit fluids as told by your doctor).  Only take over-the-counter or prescription medicines as told by your doctor.  Avoid smoking and secondhand smoke. These can make bronchitis worse. If you are a smoker, think about using nicotine gum or skin patches. Quitting smoking will help your lungs heal faster.  Reduce the chance of getting bronchitis again by:  Washing your hands often.  Avoiding people with cold symptoms.  Trying not to touch your hands to your mouth, nose, or eyes.  Follow up with your doctor as told. GET HELP IF: Your symptoms  do not improve after 1 week of treatment. Symptoms include:  Cough.  Fever.  Coughing up thick spit.  Body aches.  Chest congestion.  Chills.  Shortness of breath.  Sore throat. GET HELP RIGHT AWAY IF:   You have an increased fever.  You have chills.  You have severe shortness of breath.  You have bloody thick spit (sputum).  You throw up (vomit) often.  You lose too much body fluid (dehydration).  You have a severe headache.  You faint. MAKE SURE YOU:   Understand these instructions.  Will watch your condition.  Will get help right away if you are not doing well or get worse.   This information is not intended to replace advice given to you by your health care provider. Make sure you discuss any questions you have with your health care provider.   Document Released: 11/22/2007 Document Revised: 02/05/2013 Document Reviewed: 11/26/2012 Elsevier Interactive Patient Education [employer].

## 2015-05-22 ENCOUNTER — Emergency Department (HOSPITAL_COMMUNITY)
Admission: EM | Admit: 2015-05-22 | Discharge: 2015-05-22 | Disposition: A | Discharge status: Home / Self Care | Payer: BLUE CROSS/BLUE SHIELD | Attending: Emergency Medicine | Admitting: Emergency Medicine

## 2015-05-22 ENCOUNTER — Encounter (HOSPITAL_COMMUNITY): Payer: Self-pay | Admitting: Emergency Medicine

## 2015-05-22 DIAGNOSIS — Y998 Other external cause status: Secondary | ICD-10-CM | POA: Diagnosis not present

## 2015-05-22 DIAGNOSIS — Z79899 Other long term (current) drug therapy: Secondary | ICD-10-CM | POA: Diagnosis not present

## 2015-05-22 DIAGNOSIS — Y288XXA Contact with other sharp object, undetermined intent, initial encounter: Secondary | ICD-10-CM | POA: Diagnosis not present

## 2015-05-22 DIAGNOSIS — Z23 Encounter for immunization: Secondary | ICD-10-CM | POA: Diagnosis not present

## 2015-05-22 DIAGNOSIS — F17210 Nicotine dependence, cigarettes, uncomplicated: Secondary | ICD-10-CM | POA: Diagnosis not present

## 2015-05-22 DIAGNOSIS — Y9289 Other specified places as the place of occurrence of the external cause: Secondary | ICD-10-CM | POA: Diagnosis not present

## 2015-05-22 DIAGNOSIS — S61211A Laceration without foreign body of left index finger without damage to nail, initial encounter: Secondary | ICD-10-CM | POA: Diagnosis not present

## 2015-05-22 DIAGNOSIS — I10 Essential (primary) hypertension: Secondary | ICD-10-CM | POA: Insufficient documentation

## 2015-05-22 DIAGNOSIS — Z8659 Personal history of other mental and behavioral disorders: Secondary | ICD-10-CM | POA: Diagnosis not present

## 2015-05-22 DIAGNOSIS — E782 Mixed hyperlipidemia: Secondary | ICD-10-CM | POA: Insufficient documentation

## 2015-05-22 DIAGNOSIS — Y9389 Activity, other specified: Secondary | ICD-10-CM | POA: Diagnosis not present

## 2015-05-22 HISTORY — DX: Essential (primary) hypertension: I10

## 2015-05-22 MED ORDER — BACITRACIN ZINC 500 UNIT/GM EX OINT: 1.0000 "application " | TOPICAL_OINTMENT | Freq: Two times a day (BID) | CUTANEOUS | Status: DC

## 2015-05-22 MED ORDER — LIDOCAINE HCL (PF) 1 % IJ SOLN
5.0000 mL | Freq: Once | INTRAMUSCULAR | Status: AC
  Administered 2015-05-22: 5 mL via INTRADERMAL
  Filled 2015-05-22: qty 5

## 2015-05-22 MED ORDER — IBUPROFEN 800 MG PO TABS: 800.0000 mg | ORAL_TABLET | Freq: Three times a day (TID) | ORAL | Status: DC

## 2015-05-22 MED ORDER — TETANUS-DIPHTH-ACELL PERTUSSIS 5-2.5-18.5 LF-MCG/0.5 IM SUSP
0.5000 mL | Freq: Once | INTRAMUSCULAR | Status: AC
  Administered 2015-05-22: 0.5 mL via INTRAMUSCULAR
  Filled 2015-05-22: qty 0.5

## 2015-05-22 NOTE — ED Notes (Signed)
Pt left AMA °

## 2015-05-22 NOTE — ED Provider Notes (Signed)
CSN: [id number]     Arrival date & time 05/22/15  0056 History  By signing my name below, I, Hansel Feinstein, attest that this documentation has been prepared under the direction and in the presence of Noemi Chapel, MD. Electronically Signed: Hansel Feinstein, ED Scribe. 05/22/2015. 2:13 AM.    Chief Complaint  Patient presents with  . Laceration   The history is provided by the patient. No language interpreter was used.    HPI Comments: Melanie Powell is a 44 y.o. female who presents to the Emergency Department complaining of a laceration with controlled bleeding to the pad of the left second digit that occurred 2 hours ago. Pt states she accidentally cut herself on a razor blade. Bleeding was controlled with a bandage applied PTA. Pt is not on any anticoagulants. No other injuries. Tdap out of date.   Past Medical History  Diagnosis Date  . SVD (spontaneous vaginal delivery)     x 4  . Migraine   . Hypercholesteremia   . Anxiety   . Hypertension    Past Surgical History  Procedure Laterality Date  . Right side lung surgery      portaion of right lower lobe removed per patient - pt was in her 20's - no problems ever after surgery  . Laparoscopic assisted vaginal hysterectomy N/A 09/11/2013    Procedure: LAPAROSCOPIC ASSISTED VAGINAL HYSTERECTOMY with bilateral salpingectomy and right labial biopsy with posterior fourchette;  Surgeon: Linda Hedges, DO;  Location: [facility];  Service: Gynecology;  Laterality: N/A;   Family History  Problem Relation Age of Onset  . Brain cancer Mother   . Heart failure Father   . Parkinson's disease Father   . Parkinson's disease Brother   . Parkinson's disease Paternal Grandmother    Social History  Substance Use Topics  . Smoking status: Current Every Day Smoker -- 0.00 packs/day for 22 years    Types: Cigarettes  . Smokeless tobacco: Never Used  . Alcohol Use: 29.4 oz/week    49 Cans of beer per week     Comment: social - beer   OB History    No data  available     Review of Systems  Skin: Positive for wound.   Allergies  Review of patient's allergies indicates no known allergies.  Home Medications   Prior to Admission medications   Medication Sig Start Date End Date Taking? Authorizing Provider  albuterol (PROVENTIL HFA;VENTOLIN HFA) 108 (90 BASE) MCG/ACT inhaler Inhale 2 puffs into the lungs every 4 (four) hours as needed for wheezing or shortness of breath. 05/18/15  Yes Konrad Felix, PA  atorvastatin (LIPITOR) 20 MG tablet Take 20 mg by mouth daily. 07/18/14  Yes Historical Provider, MD  chlorpheniramine-HYDROcodone (TUSSIONEX PENNKINETIC ER) 10-8 MG/5ML SUER Take 5 mLs by mouth at bedtime as needed for cough. 05/18/15  Yes Konrad Felix, PA  ibuprofen (ADVIL,MOTRIN) 800 MG tablet Take 1 tablet (800 mg total) by mouth 3 (three) times daily. 05/22/15   Noemi Chapel, MD   BP 107/60 mmHg  Pulse 83  Temp(Src) 97.3 F (36.3 C) (Oral)  Resp 16  Ht 5\' 3"  (1.6 m)  Wt 124 lb (56.246 kg)  BMI 21.97 kg/m2  SpO2 95%  LMP 08/19/2013 Physical Exam  Constitutional: She appears well-developed and well-nourished.  HENT:  Head: Normocephalic and atraumatic.  Eyes: Conjunctivae are normal. Right eye exhibits no discharge. Left eye exhibits no discharge.  Pulmonary/Chest: Effort normal. No respiratory distress.  Neurological: She is  alert. Coordination normal.  Skin: Skin is warm and dry. No rash noted. She is not diaphoretic. No erythema.  1cm laceration to the pad of the left index finger  Psychiatric: She has a normal mood and affect.  Nursing note and vitals reviewed.  ED Course  Procedures (including critical care time) DIAGNOSTIC STUDIES: Oxygen Saturation is 98% on RA, normal by my interpretation.    COORDINATION OF CARE: 2:12 AM Discussed treatment plan with pt at bedside and pt agreed to plan.  LACERATION REPAIR PROCEDURE NOTE The patient's identification was confirmed and consent was obtained. This procedure was  performed by Noemi Chapel, MD at 3:15 AM. Site: pad of the left index finger Sterile procedures observed Anesthetic used (type and amt): <1cc directly into the wound 1% lidocaine without epi Suture type/size:5-0 proline Length: 1cm  # of Sutures: 2 Technique:simple interrupted Complexitysimple Antibx ointment applied: bacitracin  Tetanus ordered Site anesthetized, irrigated with NS, explored without evidence of foreign body, wound well approximated, site covered with dry, sterile dressing.  Patient tolerated procedure well without complications. Instructions for care discussed verbally and patient provided with additional written instructions for homecare and f/u.    MDM   Final diagnoses:  Laceration of left index finger w/o foreign body w/o damage to nail, initial encounter    Finger repaired without difficulty - mechanism does not suggest need for imaging - has superficial laceration that has a well visualized bottom of the wound - she has bleeding controlled after lac repair - VS normal, stable for d/c.  Meds given in ED:  Medications  lidocaine (PF) (XYLOCAINE) 1 % injection 5 mL (5 mLs Intradermal Given 05/22/15 0233)  Tdap (BOOSTRIX) injection 0.5 mL (0.5 mLs Intramuscular Given 05/22/15 0234)    Discharge Medication List as of 05/22/2015  3:16 AM      I personally performed the services described in this documentation, which was scribed in my presence. The recorded information has been reviewed and is accurate.       Noemi Chapel, MD 05/23/15 [phone]

## 2015-05-22 NOTE — ED Notes (Addendum)
Pt. accidentally touched a razor at home this evening , sustained laceration at left distal tip of index finger , no bleeding at arrival / dressing applied prior to arrival .

## 2016-02-01 ENCOUNTER — Emergency Department (HOSPITAL_COMMUNITY)
Admission: EM | Admit: 2016-02-01 | Discharge: 2016-02-01 | Disposition: A | Discharge status: Home / Self Care | Payer: BLUE CROSS/BLUE SHIELD | Attending: Emergency Medicine | Admitting: Emergency Medicine

## 2016-02-01 ENCOUNTER — Emergency Department (HOSPITAL_COMMUNITY): Payer: BLUE CROSS/BLUE SHIELD

## 2016-02-01 ENCOUNTER — Encounter (HOSPITAL_COMMUNITY): Payer: Self-pay | Admitting: Emergency Medicine

## 2016-02-01 DIAGNOSIS — M25562 Pain in left knee: Secondary | ICD-10-CM | POA: Diagnosis not present

## 2016-02-01 DIAGNOSIS — F17210 Nicotine dependence, cigarettes, uncomplicated: Secondary | ICD-10-CM | POA: Diagnosis not present

## 2016-02-01 DIAGNOSIS — I10 Essential (primary) hypertension: Secondary | ICD-10-CM | POA: Insufficient documentation

## 2016-02-01 MED ORDER — TRAMADOL HCL 50 MG PO TABS: 50.0000 mg | ORAL_TABLET | Freq: Four times a day (QID) | ORAL | 0 refills | Status: DC | PRN

## 2016-02-01 MED ORDER — HYDROCODONE-ACETAMINOPHEN 5-325 MG PO TABS
1.0000 | ORAL_TABLET | Freq: Once | ORAL | Status: AC
  Administered 2016-02-01: 1 via ORAL
  Filled 2016-02-01: qty 1

## 2016-02-01 MED ORDER — DICLOFENAC SODIUM 1 % TD GEL: TRANSDERMAL | 0 refills | Status: DC

## 2016-02-01 NOTE — ED Notes (Signed)
Patient transported to X-ray 

## 2016-02-01 NOTE — ED Provider Notes (Signed)
[facility] DEPT Provider Note   CSN: [id number] Arrival date & time: 02/01/16  1329   By signing my name below, I, Sonum Patel, attest that this documentation has been prepared under the direction and in the presence of Andreyah Natividad, [place]. Electronically Signed: Sonum Patel, Education administrator. 02/01/16. 1:55 PM.  History   Chief Complaint Chief Complaint  Patient presents with  . Knee Pain     The history is provided by the patient. No language interpreter was used.   HPI Comments: Melanie Powell is a 45 y.o. female who presents to the Emergency Department complaining of 5 days of constant, unchanged left knee swelling with associated left knee pain. Patient states the pain and swelling began after waking up last week. She denies known injury or trauma. She states the pain is worse with walking and at night. She reports spending the majority of the day on her feet.  She has applied ice with some relief and has taken Aleve without relief. She denies numbness or weakness.   Past Medical History:  Diagnosis Date  . Anxiety   . Hypercholesteremia   . Hypertension   . Migraine   . SVD (spontaneous vaginal delivery)    x 4    Patient Active Problem List   Diagnosis Date Noted  . Alcohol abuse 07/28/2014  . Tobacco abuse 07/28/2014  . Amblyopia 07/28/2014  . S/P hysterectomy 09/11/2013    Past Surgical History:  Procedure Laterality Date  . LAPAROSCOPIC ASSISTED VAGINAL HYSTERECTOMY N/A 09/11/2013   Procedure: LAPAROSCOPIC ASSISTED VAGINAL HYSTERECTOMY with bilateral salpingectomy and right labial biopsy with posterior fourchette;  Surgeon: Linda Hedges, DO;  Location: [facility];  Service: Gynecology;  Laterality: N/A;  . right side lung surgery     portaion of right lower lobe removed per patient - pt was in her 20's - no problems ever after surgery    OB History    No data available       Home Medications    Prior to Admission medications   Medication Sig Start Date End Date  Taking? Authorizing Provider  albuterol (PROVENTIL HFA;VENTOLIN HFA) 108 (90 BASE) MCG/ACT inhaler Inhale 2 puffs into the lungs every 4 (four) hours as needed for wheezing or shortness of breath. 05/18/15   Konrad Felix, PA  atorvastatin (LIPITOR) 20 MG tablet Take 20 mg by mouth daily. 07/18/14   Historical Provider, MD  chlorpheniramine-HYDROcodone (TUSSIONEX PENNKINETIC ER) 10-8 MG/5ML SUER Take 5 mLs by mouth at bedtime as needed for cough. 05/18/15   Konrad Felix, PA  ibuprofen (ADVIL,MOTRIN) 800 MG tablet Take 1 tablet (800 mg total) by mouth 3 (three) times daily. 05/22/15   Noemi Chapel, MD    Family History Family History  Problem Relation Age of Onset  . Brain cancer Mother   . Heart failure Father   . Parkinson's disease Father   . Parkinson's disease Brother   . Parkinson's disease Paternal Grandmother     Social History Social History  Substance Use Topics  . Smoking status: Current Every Day Smoker    Packs/day: 0.00    Years: 22.00    Types: Cigarettes  . Smokeless tobacco: Never Used  . Alcohol use 29.4 oz/week    49 Cans of beer per week     Comment: social - beer     Allergies   Review of patient's allergies indicates no known allergies.   Review of Systems Review of Systems 10 Systems reviewed and all are negative for  acute change except as noted in the HPI.    Physical Exam Updated Vital Signs BP (!) 152/105 (BP Location: Right Arm)   Pulse 96   Temp 98.5 F (36.9 C) (Oral)   Resp 18   Ht 5\' 3"  (1.6 m)   Wt 121 lb (54.9 kg)   LMP 08/19/2013   SpO2 99%   BMI 21.43 kg/m   Physical Exam  Constitutional: She is oriented to person, place, and time. She appears well-developed and well-nourished.  HENT:  Head: Normocephalic and atraumatic.  Cardiovascular: Normal rate.   Pulmonary/Chest: Effort normal.  Musculoskeletal: Normal range of motion. She exhibits tenderness. She exhibits no edema or deformity.  Left knee not edematous, no  discoloration, no erythema, no warmth. Mild medial tenderness. Full ROM. No laxity or crepitus.   Neurological: She is alert and oriented to person, place, and time.  Skin: Skin is warm and dry.  Psychiatric: She has a normal mood and affect.  Nursing note and vitals reviewed.    ED Treatments / Results  DIAGNOSTIC STUDIES: Oxygen Saturation is 99% on RA, normal by my interpretation.    COORDINATION OF CARE: 2:01 PM Will order left knee imaging. Discussed treatment plan with pt at bedside and pt agreed to plan.    Labs (all labs ordered are listed, but only abnormal results are displayed) Labs Reviewed - No data to display  EKG  EKG Interpretation None       Radiology Dg Knee Complete 4 Views Left  Result Date: 02/01/2016 CLINICAL DATA:  Pain EXAM: LEFT KNEE - COMPLETE 4+ VIEW COMPARISON:  None. FINDINGS: Frontal, lateral, and bilateral oblique views were obtained. There is no fracture or dislocation. No joint effusion. The joint spaces appear normal. No erosive change. IMPRESSION: No fracture or joint effusion.  No apparent arthropathy. Electronically Signed   By: Lowella Grip III M.D.   On: 02/01/2016 14:27    Procedures Procedures (including critical care time)  Medications Ordered in ED Medications - No data to display   Initial Impression / Assessment and Plan / ED Course  I have reviewed the triage vital signs and the nursing notes.  Pertinent labs & imaging results that were available during my care of the patient were reviewed by me and considered in my medical decision making (see chart for details).  Clinical Course   Patient X-Ray negative for obvious fracture or dislocation.  Pt advised to follow up with orthopedics. Patient declined ACE wrap while in ED, conservative therapy recommended and discussed. Patient will be discharged home & is agreeable with above plan. Returns precautions discussed. Pt appears safe for discharge.   Final Clinical  Impressions(s) / ED Diagnoses   Final diagnoses:  Left knee pain    New Prescriptions Discharge Medication List as of 02/01/2016  2:57 PM    START taking these medications   Details  diclofenac sodium (VOLTAREN) 1 % GEL Apply to affected area three to four times daily as needed, Print    traMADol (ULTRAM) 50 MG tablet Take 1 tablet (50 mg total) by mouth every 6 (six) hours as needed., Starting Tue 02/01/2016, Print        I personally performed the services described in this documentation, which was scribed in my presence. The recorded information has been reviewed and is accurate.    Anne Ng, PA-C 02/02/16 [place], MD 02/02/16 [phone]

## 2016-02-01 NOTE — ED Triage Notes (Signed)
Left knee pain x 5 days it is swelling and painful

## 2016-06-02 ENCOUNTER — Ambulatory Visit (HOSPITAL_COMMUNITY)
Admission: EM | Admit: 2016-06-02 | Discharge: 2016-06-02 | Disposition: A | Discharge status: Home / Self Care | Payer: BLUE CROSS/BLUE SHIELD

## 2016-06-06 DIAGNOSIS — I10 Essential (primary) hypertension: Secondary | ICD-10-CM | POA: Insufficient documentation

## 2016-08-30 ENCOUNTER — Other Ambulatory Visit: Payer: Self-pay | Admitting: Physician Assistant

## 2016-08-30 DIAGNOSIS — Z1231 Encounter for screening mammogram for malignant neoplasm of breast: Secondary | ICD-10-CM

## 2016-09-19 ENCOUNTER — Ambulatory Visit
Admission: RE | Admit: 2016-09-19 | Discharge: 2016-09-19 | Disposition: A | Discharge status: Home / Self Care | Payer: BLUE CROSS/BLUE SHIELD | Source: Ambulatory Visit | Attending: Physician Assistant | Admitting: Physician Assistant

## 2016-09-19 DIAGNOSIS — Z1231 Encounter for screening mammogram for malignant neoplasm of breast: Secondary | ICD-10-CM

## 2016-12-04 DIAGNOSIS — R768 Other specified abnormal immunological findings in serum: Secondary | ICD-10-CM | POA: Insufficient documentation

## 2017-02-13 ENCOUNTER — Other Ambulatory Visit: Payer: Self-pay | Admitting: Orthopedic Surgery

## 2017-03-08 ENCOUNTER — Encounter (HOSPITAL_BASED_OUTPATIENT_CLINIC_OR_DEPARTMENT_OTHER): Payer: Self-pay | Admitting: *Deleted

## 2017-03-12 ENCOUNTER — Encounter (HOSPITAL_BASED_OUTPATIENT_CLINIC_OR_DEPARTMENT_OTHER)
Admission: RE | Admit: 2017-03-12 | Discharge: 2017-03-12 | Disposition: A | Discharge status: Home / Self Care | Payer: BLUE CROSS/BLUE SHIELD | Source: Ambulatory Visit | Attending: Orthopedic Surgery | Admitting: Orthopedic Surgery

## 2017-03-12 ENCOUNTER — Other Ambulatory Visit: Payer: Self-pay

## 2017-03-12 DIAGNOSIS — Z79899 Other long term (current) drug therapy: Secondary | ICD-10-CM | POA: Diagnosis not present

## 2017-03-12 DIAGNOSIS — F17210 Nicotine dependence, cigarettes, uncomplicated: Secondary | ICD-10-CM | POA: Diagnosis not present

## 2017-03-12 DIAGNOSIS — G5601 Carpal tunnel syndrome, right upper limb: Secondary | ICD-10-CM | POA: Diagnosis not present

## 2017-03-12 DIAGNOSIS — E7800 Pure hypercholesterolemia, unspecified: Secondary | ICD-10-CM | POA: Diagnosis not present

## 2017-03-12 DIAGNOSIS — F419 Anxiety disorder, unspecified: Secondary | ICD-10-CM | POA: Diagnosis not present

## 2017-03-12 DIAGNOSIS — I10 Essential (primary) hypertension: Secondary | ICD-10-CM | POA: Diagnosis not present

## 2017-03-15 ENCOUNTER — Encounter (HOSPITAL_BASED_OUTPATIENT_CLINIC_OR_DEPARTMENT_OTHER)
Admission: RE | Disposition: A | Discharge status: Home / Self Care | Payer: Self-pay | Source: Ambulatory Visit | Attending: Orthopedic Surgery

## 2017-03-15 ENCOUNTER — Ambulatory Visit (HOSPITAL_BASED_OUTPATIENT_CLINIC_OR_DEPARTMENT_OTHER): Payer: BLUE CROSS/BLUE SHIELD | Admitting: Anesthesiology

## 2017-03-15 ENCOUNTER — Encounter (HOSPITAL_BASED_OUTPATIENT_CLINIC_OR_DEPARTMENT_OTHER): Payer: Self-pay | Admitting: *Deleted

## 2017-03-15 ENCOUNTER — Ambulatory Visit (HOSPITAL_BASED_OUTPATIENT_CLINIC_OR_DEPARTMENT_OTHER)
Admission: RE | Admit: 2017-03-15 | Discharge: 2017-03-15 | Disposition: A | Discharge status: Home / Self Care | Payer: BLUE CROSS/BLUE SHIELD | Source: Ambulatory Visit | Attending: Orthopedic Surgery | Admitting: Orthopedic Surgery

## 2017-03-15 DIAGNOSIS — G5601 Carpal tunnel syndrome, right upper limb: Secondary | ICD-10-CM | POA: Diagnosis not present

## 2017-03-15 DIAGNOSIS — F419 Anxiety disorder, unspecified: Secondary | ICD-10-CM | POA: Insufficient documentation

## 2017-03-15 DIAGNOSIS — I10 Essential (primary) hypertension: Secondary | ICD-10-CM | POA: Insufficient documentation

## 2017-03-15 DIAGNOSIS — F17210 Nicotine dependence, cigarettes, uncomplicated: Secondary | ICD-10-CM | POA: Insufficient documentation

## 2017-03-15 DIAGNOSIS — E7800 Pure hypercholesterolemia, unspecified: Secondary | ICD-10-CM | POA: Insufficient documentation

## 2017-03-15 DIAGNOSIS — Z79899 Other long term (current) drug therapy: Secondary | ICD-10-CM | POA: Insufficient documentation

## 2017-03-15 HISTORY — PX: CARPAL TUNNEL RELEASE: SHX101

## 2017-03-15 SURGERY — CARPAL TUNNEL RELEASE
Anesthesia: Monitor Anesthesia Care | Site: Hand | Laterality: Right

## 2017-03-15 MED ORDER — FENTANYL CITRATE (PF) 100 MCG/2ML IJ SOLN
INTRAMUSCULAR | Status: AC
  Filled 2017-03-15: qty 2

## 2017-03-15 MED ORDER — CHLORHEXIDINE GLUCONATE 4 % EX LIQD: 60.0000 mL | Freq: Once | CUTANEOUS | Status: DC

## 2017-03-15 MED ORDER — ONDANSETRON HCL 4 MG/2ML IJ SOLN
INTRAMUSCULAR | Status: DC | PRN
  Administered 2017-03-15: 4 mg via INTRAVENOUS

## 2017-03-15 MED ORDER — SCOPOLAMINE 1 MG/3DAYS TD PT72: 1.0000 | MEDICATED_PATCH | Freq: Once | TRANSDERMAL | Status: DC | PRN

## 2017-03-15 MED ORDER — LACTATED RINGERS IV SOLN
INTRAVENOUS | Status: DC
  Administered 2017-03-15: 09:00:00 via INTRAVENOUS
  Administered 2017-03-15: 10 mL/h via INTRAVENOUS

## 2017-03-15 MED ORDER — MIDAZOLAM HCL 2 MG/2ML IJ SOLN
INTRAMUSCULAR | Status: AC
  Filled 2017-03-15: qty 2

## 2017-03-15 MED ORDER — ONDANSETRON HCL 4 MG/2ML IJ SOLN
INTRAMUSCULAR | Status: AC
  Filled 2017-03-15: qty 2

## 2017-03-15 MED ORDER — FENTANYL CITRATE (PF) 100 MCG/2ML IJ SOLN
50.0000 ug | INTRAMUSCULAR | Status: AC | PRN
  Administered 2017-03-15: 25 ug via INTRAVENOUS
  Administered 2017-03-15: 50 ug via INTRAVENOUS
  Administered 2017-03-15: 25 ug via INTRAVENOUS

## 2017-03-15 MED ORDER — MIDAZOLAM HCL 2 MG/2ML IJ SOLN
1.0000 mg | INTRAMUSCULAR | Status: DC | PRN
  Administered 2017-03-15 (×2): 1 mg via INTRAVENOUS

## 2017-03-15 MED ORDER — FENTANYL CITRATE (PF) 100 MCG/2ML IJ SOLN
25.0000 ug | INTRAMUSCULAR | Status: DC | PRN
  Administered 2017-03-15 (×2): 25 ug via INTRAVENOUS

## 2017-03-15 MED ORDER — HYDROCODONE-ACETAMINOPHEN 5-325 MG PO TABS: ORAL_TABLET | ORAL | 0 refills | Status: DC

## 2017-03-15 MED ORDER — CEFAZOLIN SODIUM-DEXTROSE 2-4 GM/100ML-% IV SOLN
2.0000 g | INTRAVENOUS | Status: AC
  Administered 2017-03-15: 2 g via INTRAVENOUS

## 2017-03-15 MED ORDER — PROMETHAZINE HCL 25 MG/ML IJ SOLN: 6.2500 mg | INTRAMUSCULAR | Status: DC | PRN

## 2017-03-15 MED ORDER — PROPOFOL 500 MG/50ML IV EMUL
INTRAVENOUS | Status: DC | PRN
  Administered 2017-03-15: 100 ug/kg/min via INTRAVENOUS

## 2017-03-15 MED ORDER — PROPOFOL 500 MG/50ML IV EMUL
INTRAVENOUS | Status: AC
  Filled 2017-03-15: qty 50

## 2017-03-15 MED ORDER — CEFAZOLIN SODIUM-DEXTROSE 2-4 GM/100ML-% IV SOLN
INTRAVENOUS | Status: AC
  Filled 2017-03-15: qty 100

## 2017-03-15 MED ORDER — BUPIVACAINE HCL (PF) 0.25 % IJ SOLN
INTRAMUSCULAR | Status: DC | PRN
  Administered 2017-03-15: 8 mL

## 2017-03-15 MED ORDER — LIDOCAINE HCL (CARDIAC) 20 MG/ML IV SOLN
INTRAVENOUS | Status: DC | PRN
  Administered 2017-03-15: 150 mg via INTRAVENOUS

## 2017-03-15 SURGICAL SUPPLY — 38 items
BANDAGE ACE 3X5.8 VEL STRL LF (GAUZE/BANDAGES/DRESSINGS) ×3 IMPLANT
BLADE SURG 15 STRL LF DISP TIS (BLADE) ×2 IMPLANT
BLADE SURG 15 STRL SS (BLADE) ×6
BNDG CMPR 9X4 STRL LF SNTH (GAUZE/BANDAGES/DRESSINGS) ×1
BNDG ESMARK 4X9 LF (GAUZE/BANDAGES/DRESSINGS) ×2 IMPLANT
BNDG GAUZE ELAST 4 BULKY (GAUZE/BANDAGES/DRESSINGS) ×3 IMPLANT
CHLORAPREP W/TINT 26ML (MISCELLANEOUS) ×3 IMPLANT
CORD BIPOLAR FORCEPS 12FT (ELECTRODE) ×3 IMPLANT
COVER BACK TABLE 60X90IN (DRAPES) ×3 IMPLANT
COVER MAYO STAND STRL (DRAPES) ×3 IMPLANT
CUFF TOURNIQUET SINGLE 18IN (TOURNIQUET CUFF) ×3 IMPLANT
DRAPE EXTREMITY T 121X128X90 (DRAPE) ×3 IMPLANT
DRAPE SURG 17X23 STRL (DRAPES) ×3 IMPLANT
DRSG PAD ABDOMINAL 8X10 ST (GAUZE/BANDAGES/DRESSINGS) ×3 IMPLANT
GAUZE SPONGE 4X4 12PLY STRL (GAUZE/BANDAGES/DRESSINGS) ×3 IMPLANT
GAUZE XEROFORM 1X8 LF (GAUZE/BANDAGES/DRESSINGS) ×3 IMPLANT
GLOVE BIO SURGEON STRL SZ7.5 (GLOVE) ×3 IMPLANT
GLOVE BIOGEL M 7.0 STRL (GLOVE) ×2 IMPLANT
GLOVE BIOGEL PI IND STRL 6.5 (GLOVE) IMPLANT
GLOVE BIOGEL PI IND STRL 8 (GLOVE) ×1 IMPLANT
GLOVE BIOGEL PI INDICATOR 6.5 (GLOVE) ×2
GLOVE BIOGEL PI INDICATOR 8 (GLOVE) ×4
GOWN STRL REUS W/ TWL LRG LVL3 (GOWN DISPOSABLE) ×1 IMPLANT
GOWN STRL REUS W/ TWL XL LVL3 (GOWN DISPOSABLE) IMPLANT
GOWN STRL REUS W/TWL LRG LVL3 (GOWN DISPOSABLE)
GOWN STRL REUS W/TWL XL LVL3 (GOWN DISPOSABLE) ×6 IMPLANT
NDL HYPO 25X1 1.5 SAFETY (NEEDLE) ×1 IMPLANT
NEEDLE HYPO 25X1 1.5 SAFETY (NEEDLE) ×3 IMPLANT
NS IRRIG 1000ML POUR BTL (IV SOLUTION) ×3 IMPLANT
PACK BASIN DAY SURGERY FS (CUSTOM PROCEDURE TRAY) ×3 IMPLANT
PADDING CAST ABS 4INX4YD NS (CAST SUPPLIES) ×2
PADDING CAST ABS COTTON 4X4 ST (CAST SUPPLIES) ×1 IMPLANT
STOCKINETTE 4X48 STRL (DRAPES) ×3 IMPLANT
SUT ETHILON 4 0 PS 2 18 (SUTURE) ×3 IMPLANT
SYR BULB 3OZ (MISCELLANEOUS) ×3 IMPLANT
SYR CONTROL 10ML LL (SYRINGE) ×3 IMPLANT
TOWEL OR 17X24 6PK STRL BLUE (TOWEL DISPOSABLE) ×6 IMPLANT
UNDERPAD 30X30 (UNDERPADS AND DIAPERS) ×3 IMPLANT

## 2017-03-15 NOTE — Discharge Instructions (Addendum)

## 2017-03-15 NOTE — Anesthesia Postprocedure Evaluation (Signed)
Anesthesia Post Note  Patient: Melanie Powell  Procedure(s) Performed: Procedure(s) (LRB): Right CARPAL TUNNEL RELEASE (Right)     Patient location during evaluation: PACU Anesthesia Type: MAC and Bier Block Level of consciousness: awake and alert Pain management: pain level controlled Vital Signs Assessment: post-procedure vital signs reviewed and stable Respiratory status: spontaneous breathing, nonlabored ventilation, respiratory function stable and patient connected to nasal cannula oxygen Cardiovascular status: stable and blood pressure returned to baseline Postop Assessment: no apparent nausea or vomiting Anesthetic complications: no    Last Vitals:  Vitals:   03/15/17 1100 03/15/17 1124  BP: 115/78 120/69  Pulse: (!) 58 67  Resp: 15 16  Temp:  36.6 C  SpO2: 97% 98%    Last Pain:  Vitals:   03/15/17 1124  TempSrc: Oral  PainSc:                  [name]

## 2017-03-15 NOTE — Op Note (Signed)
03/15/2017 [facility]                              OPERATIVE REPORT   PREOPERATIVE DIAGNOSIS:  Right carpal tunnel syndrome.  POSTOPERATIVE DIAGNOSIS:  Right carpal tunnel syndrome.  PROCEDURE:  Right carpal tunnel release.  SURGEON:  Leanora Cover, MD  ASSISTANT:  none.  ANESTHESIA:  Bier block and sedation.  IV FLUIDS:  Per anesthesia flow sheet.  ESTIMATED BLOOD LOSS:  Minimal.  COMPLICATIONS:  None.  SPECIMENS:  None.  TOURNIQUET TIME:    Total Tourniquet Time Documented: Forearm (Right) - 21 minutes Total: Forearm (Right) - 21 minutes   DISPOSITION:  Stable to PACU.  LOCATION: [facility]  INDICATIONS:  46 yo female with numbness and tingling of fingers right hand.  Good response to carpal tunnel injection. She wishes to have a carpal tunnel release for management of her symptoms.  Risks, benefits and alternatives of surgery were discussed including the risk of blood loss; infection; damage to nerves, vessels, tendons, ligaments, bone; failure of surgery; need for additional surgery; complications with wound healing; continued pain; recurrence of carpal tunnel syndrome; and damage to motor branch. She voiced understanding of these risks and elected to proceed.   OPERATIVE COURSE:  After being identified preoperatively by myself, the patient and I agreed upon the procedure and site of procedure.  The surgical site was marked.  The risks, benefits, and alternatives of the surgery were reviewed and she wished to proceed.  Surgical consent had been signed.  She was given IV Ancef as preoperative antibiotic prophylaxis.  She was transferred to the operating room and placed on the operating room table in supine position with the Right upper extremity on an armboard.  Bier block and sedation was induced by Anesthesiology.  Right upper extremity was prepped and draped in normal sterile orthopaedic fashion.  A surgical pause was performed between the  surgeons, anesthesia, and operating room staff, and all were in agreement as to the patient, procedure, and site of procedure.  Tourniquet at the proximal aspect of the forearm  had been inflated for the Bier block.   Incision was made over the transverse carpal ligament and carried into the subcutaneous tissues by spreading technique.  Bipolar electrocautery was used to obtain hemostasis.  The palmar fascia was sharply incised.  The transverse carpal ligament was identified and sharply incised.  It was incised distally first.  Care was taken to ensure complete decompression distally.  It was then incised proximally.  Scissors were used to split the distal aspect of the volar antebrachial fascia.  A finger was placed into the wound to ensure complete decompression, which was the case.  The nerve was examined.  It was flattened.  The motor branch was identified and was intact.  The wound was copiously irrigated with sterile saline.  It was then closed with 4-0 nylon in a horizontal mattress fashion.  It was injected with 0.25% plain Marcaine to aid in postoperative analgesia.  It was dressed with sterile Xeroform, 4x4s, an ABD, and wrapped with Kerlix and an Ace bandage.  Tourniquet was deflated at 21 minutes.  Fingertips were pink with brisk capillary refill after deflation of the tourniquet.  Operative drapes were broken down.  The patient was awoken from anesthesia safely.  She was transferred back to stretcher and taken to the PACU in stable condition.  I will see her back in  the office in 1 week for postoperative followup.  I will give her a prescription for norco 5/325 1-2 tabs PO q6 hours prn pain, dispense #20.    Tennis Must, MD Electronically signed, 03/15/17

## 2017-03-15 NOTE — Transfer of Care (Signed)
Immediate Anesthesia Transfer of Care Note  Patient: Melanie Powell  Procedure(s) Performed: Procedure(s) with comments: Right CARPAL TUNNEL RELEASE (Right) - Bier Block  Patient Location: PACU  Anesthesia Type:MAC  Level of Consciousness: awake, alert  and oriented  Airway & Oxygen Therapy: Patient Spontanous Breathing  Post-op Assessment: Report given to RN and Post -op Vital signs reviewed and stable  Post vital signs: Reviewed and stable  Last Vitals:  Vitals:   03/15/17 0843  BP: 108/84  Pulse: 75  Resp: 16  Temp: 36.8 C  SpO2: 98%    Last Pain:  Vitals:   03/15/17 0843  TempSrc: Oral  PainSc: 0-No pain      Patients Stated Pain Goal: 3 (09/62/83 6629)  Complications: No apparent anesthesia complications

## 2017-03-15 NOTE — Anesthesia Procedure Notes (Signed)
Anesthesia Regional Block: Bier block (IV Regional)   Pre-Anesthetic Checklist: ,, timeout performed, Correct Patient, Correct Site, Correct Laterality, Correct Procedure, Correct Position, site marked, Risks and benefits discussed, Surgical consent,  Pre-op evaluation,  At surgeon's request  Laterality: Right  Prep: alcohol swabs       Needles:  Injection technique: Single-shot      Needle Gauge: 20     Additional Needles:   Procedures:,,,,,,,, #20gu IV placed  Narrative:   Performed by: With CRNAs

## 2017-03-15 NOTE — Brief Op Note (Signed)
03/15/2017  10:19 AM  PATIENT:  Melanie Powell  46 y.o. female  PRE-OPERATIVE DIAGNOSIS:  RIGHT CARPAL TUNNEL SYNDROME  POST-OPERATIVE DIAGNOSIS:  RIGHT CARPAL TUNNEL SYNDROME  PROCEDURE:  Procedure(s) with comments: Right CARPAL TUNNEL RELEASE (Right) - Bier Block  SURGEON:  Surgeon(s) and Role:    * Leanora Cover, MD - Primary  PHYSICIAN ASSISTANT:   ASSISTANTS: none   ANESTHESIA:   Bier block with sedation  EBL:  No intake/output data recorded.  BLOOD ADMINISTERED:none  DRAINS: none   LOCAL MEDICATIONS USED:  MARCAINE     SPECIMEN:  No Specimen  DISPOSITION OF SPECIMEN:  N/A  COUNTS:  YES  TOURNIQUET:   Total Tourniquet Time Documented: Forearm (Right) - 21 minutes Total: Forearm (Right) - 21 minutes   DICTATION: .Note written in EPIC  PLAN OF CARE: Discharge to home after PACU  PATIENT DISPOSITION:  PACU - hemodynamically stable.

## 2017-03-15 NOTE — H&P (Signed)
Melanie Powell is an 46 y.o. female.   Chief Complaint: right carpal tunnel syndrome HPI: 46 yo female with numbness in right hand.  Good response to carpal tunnel injection.  She wishes to have a carpal tunnel release.   Allergies: No Known Allergies  Past Medical History:  Diagnosis Date  . Anxiety   . Hypercholesteremia   . Hypertension   . Migraine   . SVD (spontaneous vaginal delivery)    x 4    Past Surgical History:  Procedure Laterality Date  . LAPAROSCOPIC ASSISTED VAGINAL HYSTERECTOMY N/A 09/11/2013   Procedure: LAPAROSCOPIC ASSISTED VAGINAL HYSTERECTOMY with bilateral salpingectomy and right labial biopsy with posterior fourchette;  Surgeon: Linda Hedges, DO;  Location: [facility];  Service: Gynecology;  Laterality: N/A;  . right side lung surgery     portaion of right lower lobe removed per patient - pt was in her 20's - no problems ever after surgery    Family History: Family History  Problem Relation Age of Onset  . Brain cancer Mother   . Heart failure Father   . Parkinson's disease Father   . Parkinson's disease Brother   . Parkinson's disease Paternal Grandmother   . Breast cancer Sister     Social History:   reports that she has been smoking Cigarettes.  She has a 22.00 pack-year smoking history. She has never used smokeless tobacco. She reports that she drinks alcohol. She reports that she does not use drugs.  Medications: Medications Prior to Admission  Medication Sig Dispense Refill  . atorvastatin (LIPITOR) 20 MG tablet Take 20 mg by mouth daily.  1  . diclofenac sodium (VOLTAREN) 1 % GEL Apply to affected area three to four times daily as needed 100 g 0  . gabapentin (NEURONTIN) 600 MG tablet Take 600 mg by mouth 3 (three) times daily.    . metoprolol tartrate (LOPRESSOR) 50 MG tablet Take 50 mg by mouth 2 (two) times daily.      No results found for this or any previous visit (from the past 48 hour(s)).  No results found.   A comprehensive review  of systems was negative.  Height 5\' 3"  (1.6 m), weight 54.4 kg (120 lb), last menstrual period 08/19/2013.  General appearance: alert, cooperative and appears stated age Head: Normocephalic, without obvious abnormality, atraumatic Neck: supple, symmetrical, trachea midline Resp: clear to auscultation bilaterally Cardio: regular rate and rhythm GI: non-tender Extremities: Intact sensation and capillary refill all digits.  +epl/fpl/io.  No wounds.  Pulses: 2+ and symmetric Skin: Skin color, texture, turgor normal. No rashes or lesions Neurologic: Grossly normal Incision/Wound:none  Assessment/Plan Right carpal tunnel syndrome.  Non operative and operative treatment options were discussed with the patient and patient wishes to proceed with operative treatment. Risks, benefits, and alternatives of surgery were discussed and the patient agrees with the plan of care.   Melanie Powell 03/15/2017, 8:44 AM

## 2017-03-15 NOTE — Anesthesia Preprocedure Evaluation (Addendum)
Anesthesia Evaluation  Patient identified by MRN, date of birth, ID band Patient awake    Reviewed: Allergy & Precautions, NPO status , Patient's Chart, lab work & pertinent test results  Airway Mallampati: II  TM Distance: >3 FB Neck ROM: Full    Dental   Pulmonary Current Smoker,    breath sounds clear to auscultation       Cardiovascular hypertension, Pt. on home beta blockers  Rhythm:Regular Rate:Normal     Neuro/Psych  Headaches, Anxiety    GI/Hepatic negative GI ROS, Neg liver ROS,   Endo/Other  negative endocrine ROS  Renal/GU negative Renal ROS     Musculoskeletal   Abdominal   Peds  Hematology negative hematology ROS (+)   Anesthesia Other Findings   Reproductive/Obstetrics                             Anesthesia Physical Anesthesia Plan  ASA: II  Anesthesia Plan: MAC and Bier Block   Post-op Pain Management:    Induction: Intravenous  PONV Risk Score and Plan: 1 and Ondansetron, Propofol infusion and Treatment may vary due to age or medical condition  Airway Management Planned: Natural Airway and Simple Face Mask  Additional Equipment:   Intra-op Plan:   Post-operative Plan:   Informed Consent: I have reviewed the patients History and Physical, chart, labs and discussed the procedure including the risks, benefits and alternatives for the proposed anesthesia with the patient or authorized representative who has indicated his/her understanding and acceptance.     Plan Discussed with:   Anesthesia Plan Comments:         Anesthesia Quick Evaluation

## 2017-03-16 ENCOUNTER — Encounter (HOSPITAL_BASED_OUTPATIENT_CLINIC_OR_DEPARTMENT_OTHER): Payer: Self-pay | Admitting: Orthopedic Surgery

## 2017-04-24 IMAGING — DX DG KNEE COMPLETE 4+V*L*
4 series · 4 of 4 positions shown · non-contrast
Comparison: None.

CLINICAL DATA: Pain

EXAM:
LEFT KNEE - COMPLETE 4+ VIEW

[knee ap]
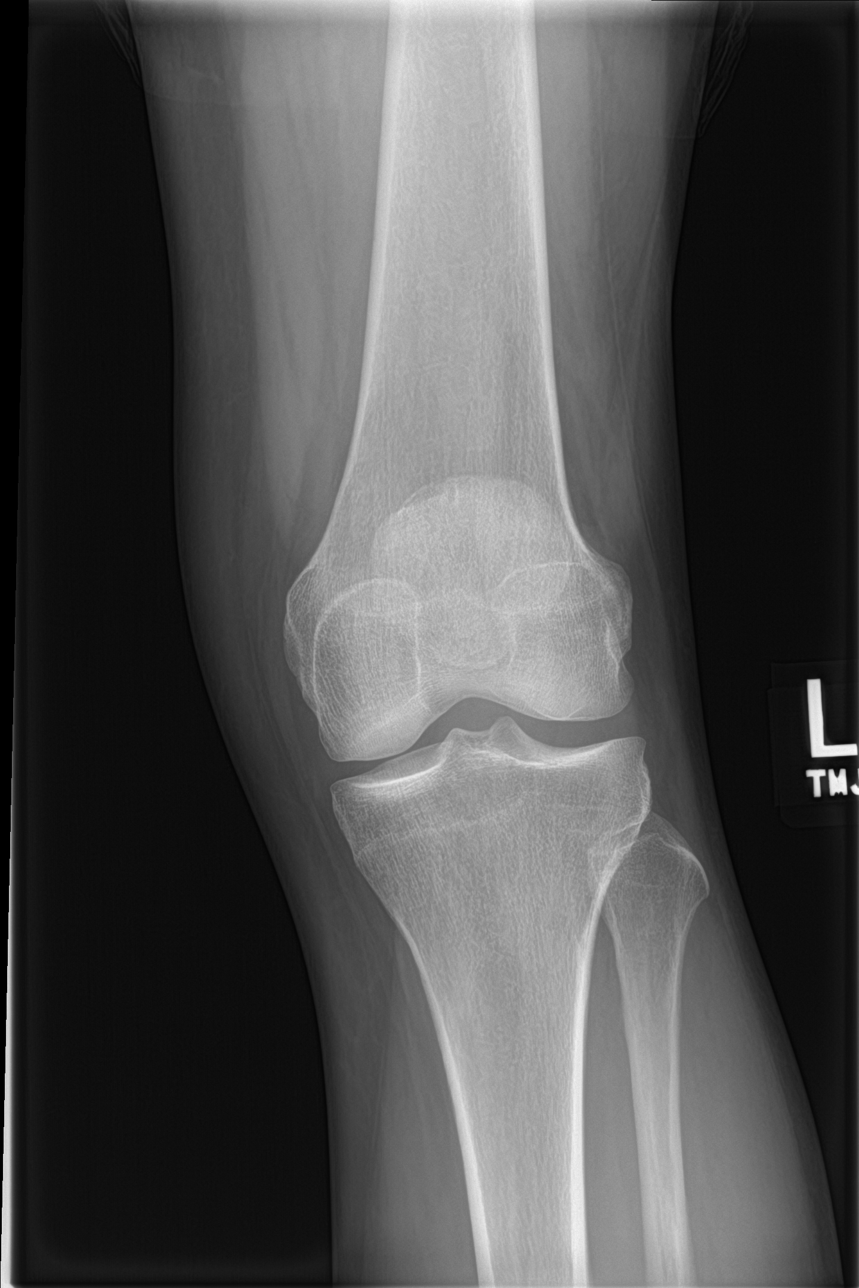

[tunnel]
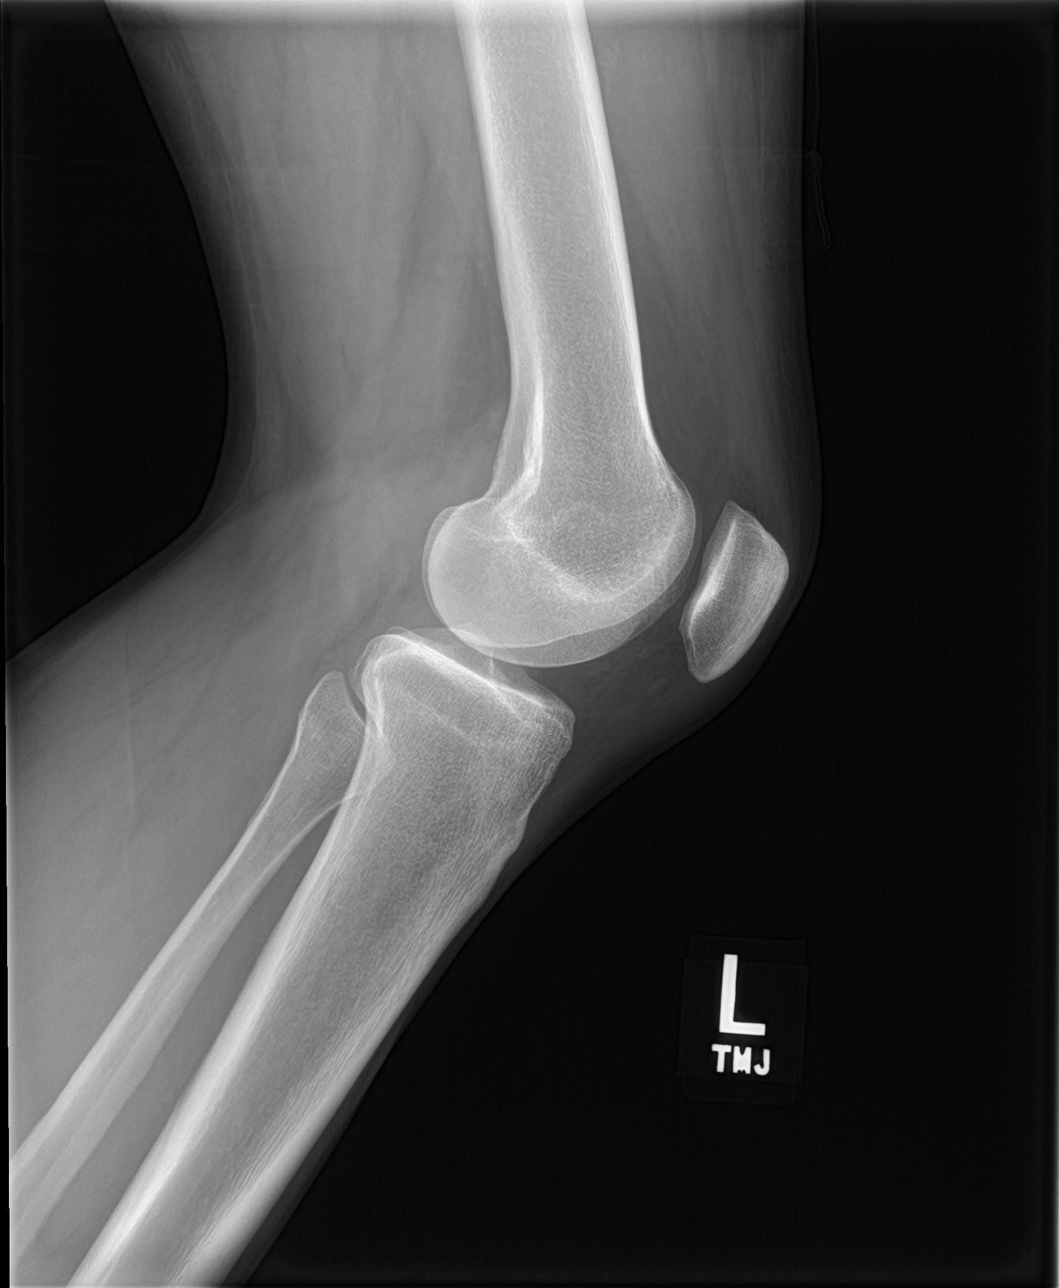

[knee obl (1 of 2)]
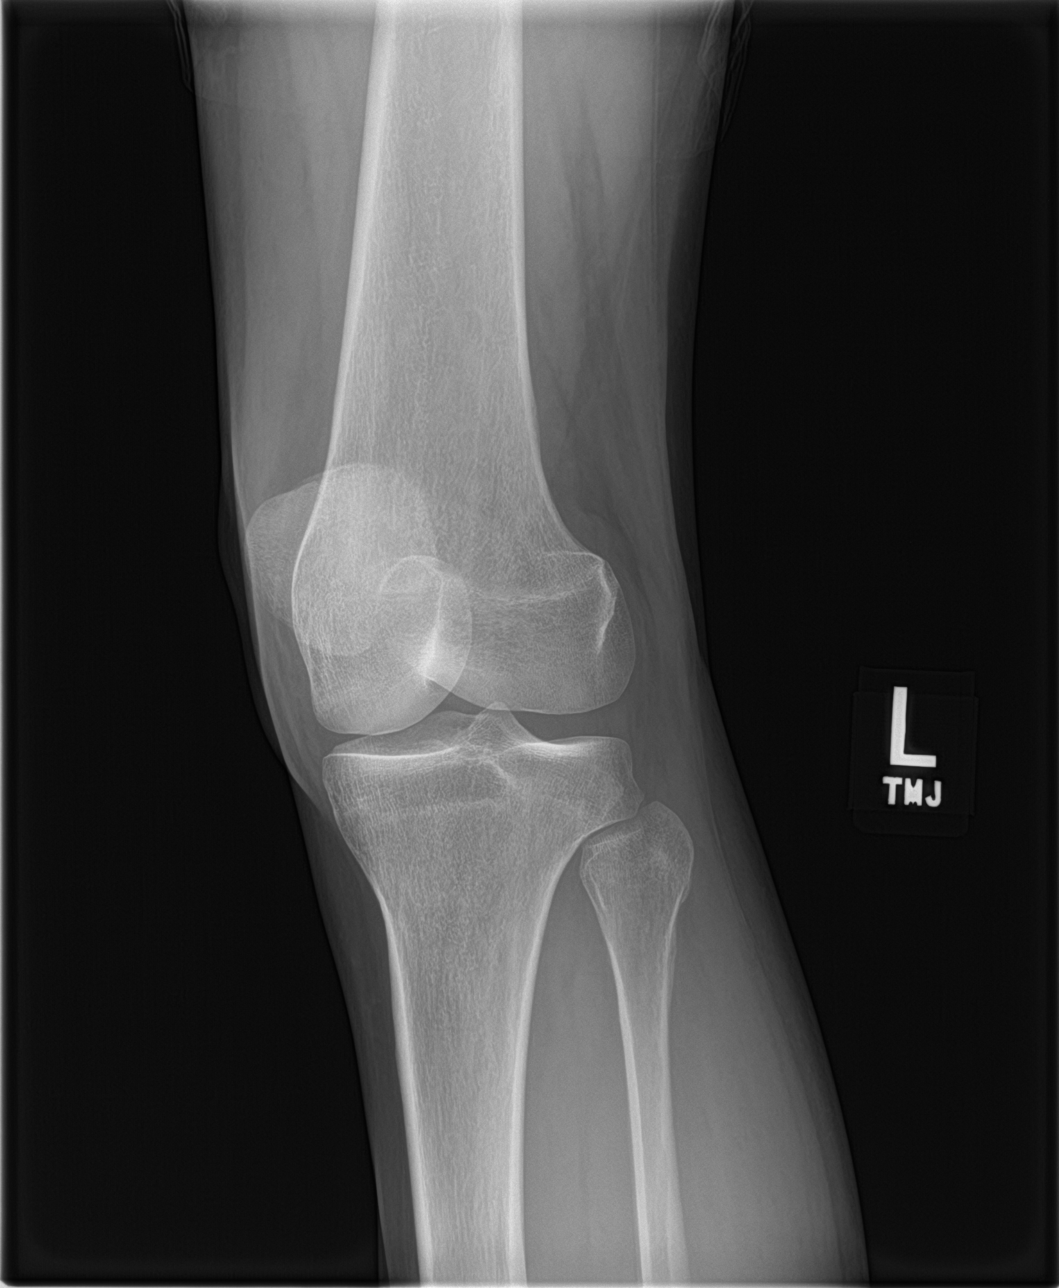

[knee obl (2 of 2)]
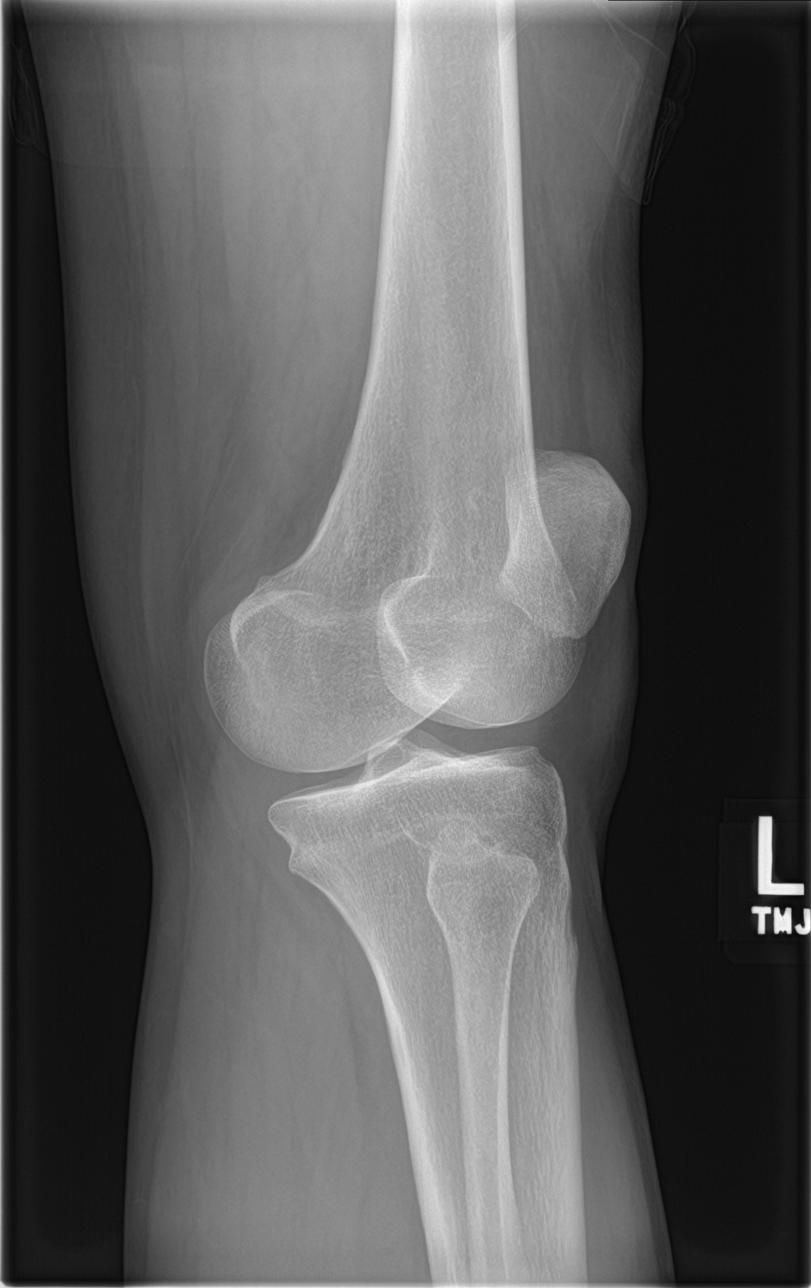

[4 of 4 positions shown; findings below may reference images not displayed]

FINDINGS: Frontal, lateral, and bilateral oblique views were obtained. There
is no fracture or dislocation. No joint effusion. The joint spaces
appear normal. No erosive change.
IMPRESSION: No fracture or joint effusion.  No apparent arthropathy.

## 2017-09-07 ENCOUNTER — Encounter: Payer: Self-pay | Admitting: Physician Assistant

## 2017-09-13 ENCOUNTER — Encounter: Payer: Self-pay | Admitting: Physician Assistant

## 2017-09-18 ENCOUNTER — Institutional Professional Consult (permissible substitution): Payer: BLUE CROSS/BLUE SHIELD | Admitting: Pulmonary Disease

## 2017-09-21 ENCOUNTER — Encounter: Payer: Self-pay | Admitting: Physician Assistant

## 2017-09-21 ENCOUNTER — Ambulatory Visit (INDEPENDENT_AMBULATORY_CARE_PROVIDER_SITE_OTHER): Payer: BLUE CROSS/BLUE SHIELD | Admitting: Physician Assistant

## 2017-09-21 VITALS — BP 102/70 | HR 78 | Ht 64.0 in | Wt 137.5 lb

## 2017-09-21 DIAGNOSIS — R112 Nausea with vomiting, unspecified: Secondary | ICD-10-CM | POA: Diagnosis not present

## 2017-09-21 DIAGNOSIS — R195 Other fecal abnormalities: Secondary | ICD-10-CM

## 2017-09-21 DIAGNOSIS — R197 Diarrhea, unspecified: Secondary | ICD-10-CM

## 2017-09-21 DIAGNOSIS — K219 Gastro-esophageal reflux disease without esophagitis: Secondary | ICD-10-CM | POA: Diagnosis not present

## 2017-09-21 DIAGNOSIS — R1013 Epigastric pain: Secondary | ICD-10-CM

## 2017-09-21 MED ORDER — PANTOPRAZOLE SODIUM 40 MG PO TBEC: 40.0000 mg | DELAYED_RELEASE_TABLET | Freq: Every day | ORAL | 3 refills | Status: DC

## 2017-09-21 NOTE — Progress Notes (Signed)
Chief Complaint: Epigastric pain  HPI:    Melanie Powell is a 47 year old Caucasian female with a past medical history as listed below, who was referred to me by Shanon Rosser, PA-C for a complaint of epigastric pain.      09/03/17 ultrasound of the gallbladder with no acute findings.  08/20/17 labs TSH, CMP, CBC normal.    Today, explains that over the past 2 months she has had episodes of severe epigastric pain which leaves her laying in bed, somewhat helped by over-the-counter Zantac, along with this pain she developsnausea and has vomited occasionally.  Happens when she eats "certain foods".  2 months ago started after eating a few bites of a cheeseburger.  Also describes shooting pain from her stomach through to her back rated as a 10/10.  Has almost daily heartburn and reflux symptoms.  Occasionally has diarrhea which is increased over the past few months as well sometimes 3-4 times a day or more.    Denies fever, chills, blood in her stool, melena, weight loss or symptoms that awaken her from sleep.  Past Medical History:  Diagnosis Date  . Anxiety   . Hypercholesteremia   . Hypertension   . Migraine   . SVD (spontaneous vaginal delivery)    x 4    Past Surgical History:  Procedure Laterality Date  . CARPAL TUNNEL RELEASE Right 03/15/2017   Procedure: Right CARPAL TUNNEL RELEASE;  Surgeon: Leanora Cover, MD;  Location: [facility];  Service: Orthopedics;  Laterality: Right;  Bier Block  . LAPAROSCOPIC ASSISTED VAGINAL HYSTERECTOMY N/A 09/11/2013   Procedure: LAPAROSCOPIC ASSISTED VAGINAL HYSTERECTOMY with bilateral salpingectomy and right labial biopsy with posterior fourchette;  Surgeon: Linda Hedges, DO;  Location: [facility];  Service: Gynecology;  Laterality: N/A;  . 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 diclofenac  sodium (VOLTAREN) 1 % GEL Apply to affected area three to four times daily as needed 100 g 0  . gabapentin (NEURONTIN) 600 MG tablet Take 600 mg by mouth 3 (three) times daily.    Marland Kitchen HYDROcodone-acetaminophen (NORCO) 5-325 MG tablet 1-2 tabs po q6 hours prn pain 20 tablet 0  . metoprolol tartrate (LOPRESSOR) 50 MG tablet Take 50 mg by mouth 2 (two) times daily.    . rosuvastatin (CRESTOR) 20 MG tablet Take 20 mg by mouth daily.     No current facility-administered medications for this visit.     Allergies as of 09/21/2017  . (No Known Allergies)    Family History  Problem Relation Age of Onset  . Brain cancer Mother   . Heart failure Father   . Parkinson's disease Father   . Parkinson's disease Brother   . Parkinson's disease Paternal Grandmother   . Breast cancer Sister   . Ovarian cancer Maternal Aunt     Social History   Socioeconomic History  . Marital status: Married    Spouse name: [place]  . Number of children: 4  . Years of education: 57  . Highest education level: Not on file  Occupational History  . Occupation: Materials engineer  Social Needs  . Financial resource strain: Not on file  . Food insecurity:    Worry: Not on file    Inability: Not on file  . Transportation needs:    Medical: Not  on file    Non-medical: Not on file  Tobacco Use  . Smoking status: Current Every Day Smoker    Packs/day: 1.00    Years: 22.00    Pack years: 22.00    Types: Cigarettes  . Smokeless tobacco: Never Used  Substance and Sexual Activity  . Alcohol use: Yes    Comment: social - beer  . Drug use: No  . Sexual activity: Yes    Birth control/protection: None    Comment: husband - vasectomy  Lifestyle  . Physical activity:    Days per week: Not on file    Minutes per session: Not on file  . Stress: Not on file  Relationships  . Social connections:    Talks on phone: Not on file    Gets together: Not on file    Attends religious service: Not on file    Active member of  club or organization: Not on file    Attends meetings of clubs or organizations: Not on file    Relationship status: Not on file  . Intimate partner violence:    Fear of current or ex partner: Not on file    Emotionally abused: Not on file    Physically abused: Not on file    Forced sexual activity: Not on file  Other Topics Concern  . Not on file  Social History Narrative   Lives at home    Has four children and one grandchild   Is a homemaker    Review of Systems:    Constitutional: No weight loss, fever or chills Skin: No rash  Cardiovascular: No chest pain Respiratory: No SOB  Gastrointestinal: See HPI and otherwise negative Genitourinary: No dysuria Neurological: No headache Musculoskeletal: No new muscle or joint pain Hematologic: No bleeding  Psychiatric: No history of depression or anxiety   Physical Exam:  Vital signs: BP 102/70   Pulse 78   Ht 5\' 4"  (1.626 m)   Wt 137 lb 8 oz (62.4 kg)   LMP 08/19/2013   BMI 23.60 kg/m    Constitutional:   Pleasant Caucasian female appears to be in NAD, Well developed, Well nourished, alert and cooperative Head:  Normocephalic and atraumatic. Eyes:   PEERL, EOMI. No icterus. Conjunctiva pink. Ears:  Normal auditory acuity. Neck:  Supple Throat: Oral cavity and pharynx without inflammation, swelling or lesion.  Respiratory: Respirations even and unlabored. Lungs clear to auscultation bilaterally.   No wheezes, crackles, or rhonchi.  Cardiovascular: Normal S1, S2. No MRG. Regular rate and rhythm. No peripheral edema, cyanosis or pallor.  Gastrointestinal:  Soft, nondistended, moderate epigastric TTP, no rebound or guarding. Normal bowel sounds. No appreciable masses or hepatomegaly. Rectal:  Not performed.  Msk:  Symmetrical without gross deformities. Without edema, no deformity or joint abnormality.  Neurologic:  Alert and  oriented x4;  grossly normal neurologically.  Skin:   Dry and intact without significant lesions or  rashes. Psychiatric:  Demonstrates good judgement and reason without abnormal affect or behaviors.  See HPI for recent labs and imaging.  Assessment: 1.  Epigastric abdominal pain: 09/03/17 right upper quadrant ultrasound negative/normal, recent labs CMP, CBC normal, describes nausea and vomiting with certain foods as well as epigastric pain and daily reflux symptoms; consider gastritis versus PUD versus H. pylori versus other 2.  GERD: Worse over the past 2 months with above 3.  Nausea and vomiting: Occasionally when she has abdominal pain 4.  Loose stools: With all the above, likely related to gastritis  and reflux  Plan: 1.  Prescribe Pantoprazole 40 mg daily, 30-60 minutes before eating breakfast.  #30 with 2 refills 2.  Reviewed antireflux diet and lifestyle modifications. 3.  Discussed that if the patient is no better after above, she should call and let us know after the first couple of weeks.  Can always increase to Pantoprazole twice daily until she is seen in clinic.  If no better at time of follow-up would recommend an EGD for further evaluation. 4.  Patient to follow in clinic with me in 4-6 weeks.  She was assigned to Dr. Ardis Hughs today.  Melanie Newer, PA-C [facility] 09/21/2017, 11:26 AM  Cc: Shanon Rosser, PA-C

## 2017-09-21 NOTE — Patient Instructions (Signed)
We have sent the following medications to your pharmacy for you to pick up at your convenience: Pantoprazole 40 mg 30-60 minutes before breakfast  We have given you a handout on GERD.

## 2017-09-21 NOTE — Progress Notes (Signed)
I agree with the above note, plan 

## 2017-11-01 ENCOUNTER — Ambulatory Visit: Payer: BLUE CROSS/BLUE SHIELD | Admitting: Physician Assistant

## 2017-11-01 ENCOUNTER — Other Ambulatory Visit: Payer: Self-pay | Admitting: Physician Assistant

## 2017-11-01 ENCOUNTER — Encounter: Payer: Self-pay | Admitting: Physician Assistant

## 2017-11-01 VITALS — BP 122/78 | HR 68 | Ht 63.0 in | Wt 139.8 lb

## 2017-11-01 DIAGNOSIS — R195 Other fecal abnormalities: Secondary | ICD-10-CM | POA: Diagnosis not present

## 2017-11-01 DIAGNOSIS — R1310 Dysphagia, unspecified: Secondary | ICD-10-CM

## 2017-11-01 DIAGNOSIS — R112 Nausea with vomiting, unspecified: Secondary | ICD-10-CM

## 2017-11-01 DIAGNOSIS — K219 Gastro-esophageal reflux disease without esophagitis: Secondary | ICD-10-CM

## 2017-11-01 DIAGNOSIS — R1013 Epigastric pain: Secondary | ICD-10-CM | POA: Diagnosis not present

## 2017-11-01 MED ORDER — PANTOPRAZOLE SODIUM 40 MG PO TBEC: 40.0000 mg | DELAYED_RELEASE_TABLET | Freq: Two times a day (BID) | ORAL | 2 refills | Status: DC

## 2017-11-01 MED ORDER — RANITIDINE HCL 150 MG PO TABS: 150.0000 mg | ORAL_TABLET | Freq: Two times a day (BID) | ORAL | 2 refills | Status: DC

## 2017-11-01 NOTE — Patient Instructions (Addendum)
Increase Protonix to 40 mg twice a day 30-60 minutes before breakfast and dinner.   Use Zantac 150 mg twice a day as needed for breakthrough pain/reflux.  You have been scheduled for an endoscopy. Please follow written instructions given to you at your visit today. If you use inhalers (even only as needed), please bring them with you on the day of your procedure. Your physician has requested that you go to www.startemmi.com and enter the access code given to you at your visit today. This web site gives a general overview about your procedure. However, you should still follow specific instructions given to you by our office regarding your preparation for the procedure.

## 2017-11-01 NOTE — Progress Notes (Signed)
I agree with the above note, plan 

## 2017-11-01 NOTE — Progress Notes (Signed)
Chief Complaint: Epigastric pain, GERD, dysphagia  HPI:    Melanie Powell is a 47 year old Caucasian female with a past medical history as listed below, who was assigned to Dr. Ardis Hughs at last visit, returns to clinic today for follow-up of her epigastric pain and reflux.    Last OV 09/21/17 with 2 months of severe epigastric pain and helped by Zantac as well as nausea and some diarrhea.  Prescribed Pantoprazole 40 mg daily discussed antireflux diet and lifestyle modifications and recommended EGD if no improvement.    Today, explains that her symptoms are all about 80% better.  She is currently using Protonix 40 mg in the morning but if she still feels some epigastric pain/acid she will take another about an hour later.  There has been a decrease in her nausea as well as loose stools and she has had no further vomiting.  No longer spending days laying around in her bed due to pain, but does continue to describe that once in a while she will have a "severe burning symptom" in her epigastrium.  Also started with some dysphagia to pills which has happened twice over the past couple of weeks.  Patient has also altered her diet as discussed at last visit.  Overall she feels better but does continue with some symptoms.    Denies fever, chills, melena or weight loss.  Past Medical History:  Diagnosis Date  . Anxiety   . Hypercholesteremia   . Hypertension   . Migraine   . SVD (spontaneous vaginal delivery)    x 4    Past Surgical History:  Procedure Laterality Date  . CARPAL TUNNEL RELEASE Right 03/15/2017   Procedure: Right CARPAL TUNNEL RELEASE;  Surgeon: Leanora Cover, MD;  Location: [facility];  Service: Orthopedics;  Laterality: Right;  Bier Block  . LAPAROSCOPIC ASSISTED VAGINAL HYSTERECTOMY N/A 09/11/2013   Procedure: LAPAROSCOPIC ASSISTED VAGINAL HYSTERECTOMY with bilateral salpingectomy and right labial biopsy with posterior fourchette;  Surgeon: Linda Hedges, DO;  Location: [facility];   Service: Gynecology;  Laterality: N/A;  . 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 diclofenac sodium (VOLTAREN) 1 % GEL Apply to affected area three to four times daily as needed 100 g 0  . gabapentin (NEURONTIN) 600 MG tablet Take 600 mg by mouth 3 (three) times daily.    . metoprolol tartrate (LOPRESSOR) 50 MG tablet Take 50 mg by mouth 2 (two) times daily.    . pantoprazole (PROTONIX) 40 MG tablet Take 1 tablet (40 mg total) by mouth daily. 30 tablet 3  . rosuvastatin (CRESTOR) 20 MG tablet Take 20 mg by mouth daily.     No current facility-administered medications for this visit.     Allergies as of 11/01/2017  . (No Known Allergies)    Family History  Problem Relation Age of Onset  . Brain cancer Mother   . Heart failure Father   . Parkinson's disease Father   . Parkinson's disease Brother   . Parkinson's disease Paternal Grandmother   . Breast cancer Sister   . Ovarian cancer Maternal Aunt     Social History   Socioeconomic History  . Marital status: Married    Spouse name: [place]  . Number of children: 4  . Years of education: 22  . Highest  education level: Not on file  Occupational History  . Occupation: Materials engineer  Social Needs  . Financial resource strain: Not on file  . Food insecurity:    Worry: Not on file    Inability: Not on file  . Transportation needs:    Medical: Not on file    Non-medical: Not on file  Tobacco Use  . Smoking status: Current Every Day Smoker    Packs/day: 1.00    Years: 22.00    Pack years: 22.00    Types: Cigarettes  . Smokeless tobacco: Never Used  Substance and Sexual Activity  . Alcohol use: Yes    Comment: social - beer  . Drug use: No  . Sexual activity: Yes    Birth control/protection: None    Comment: husband - vasectomy  Lifestyle  . Physical activity:      Days per week: Not on file    Minutes per session: Not on file  . Stress: Not on file  Relationships  . Social connections:    Talks on phone: Not on file    Gets together: Not on file    Attends religious service: Not on file    Active member of club or organization: Not on file    Attends meetings of clubs or organizations: Not on file    Relationship status: Not on file  . Intimate partner violence:    Fear of current or ex partner: Not on file    Emotionally abused: Not on file    Physically abused: Not on file    Forced sexual activity: Not on file  Other Topics Concern  . Not on file  Social History Narrative   Lives at home    Has four children and one grandchild   Is a homemaker    Review of Systems:    Constitutional: No weight loss, fever or chills Cardiovascular: No chest pain Respiratory: No SOB  Gastrointestinal: See HPI and otherwise negative   Physical Exam:  Vital signs: BP 122/78   Pulse 68   Ht 5\' 3"  (1.6 m)   Wt 139 lb 12.8 oz (63.4 kg)   LMP 08/19/2013   SpO2 98%   BMI 24.76 kg/m   Constitutional:   Pleasant Caucasian female appears to be in NAD, Well developed, Well nourished, alert and cooperative Respiratory: Respirations even and unlabored. Lungs clear to auscultation bilaterally.   No wheezes, crackles, or rhonchi.  Cardiovascular: Normal S1, S2. No MRG. Regular rate and rhythm. No peripheral edema, cyanosis or pallor.  Gastrointestinal:  Soft, nondistended, moderate epigastric ttp. No rebound or guarding. Normal bowel sounds. No appreciable masses or hepatomegaly. Psychiatric:  Demonstrates good judgement and reason without abnormal affect or behaviors.  No recent labs.  Assessment: 1.  Epigastric pain: Some better, only occasional breakthrough epigastric pain; Consider relation to PUD/gastritis 2.  GERD and dysphagia: Continues with some breakthrough reflux symptoms irregardless of addition of Pantoprazole 40 mg daily, also with pain  above and now some dysphagia; consider H. pylori versus gastritis versus esophagitis versus esophageal stricture 3.  Nausea and vomiting: Mostly resolved 4.  Loose stools: Mostly resolved, likely related to gastritis  Plan: 1.  Discussed with patient that she is improved, but is now experiencing any symptoms of dysphagia and does continue with some epigastric pain irregardless of PPI.  Would recommend she have an EGD for further evaluation.  Discussed risk and benefits, limitations and alternatives and the patient agrees to proceed.  This was scheduled with  Dr. Ardis Hughs in [facility]. 2.  Increased Pantoprazole to 40 mg twice daily, 30-60 minutes before breakfast and dinner.  Prescribed #60 with 3 refills 3.  Prescribed Zantac 150 mg twice daily as needed for breakthrough reflux symptoms #60 with 2 refills 4.  Reviewed antireflux diet and lifestyle modifications. 5.  Patient to follow in clinic per recommendations from Dr. Ardis Hughs after time of procedure.  Ellouise Newer, PA-C [facility] 11/01/2017, 9:47 AM  Cc: Shanon Rosser, PA-C

## 2017-11-02 ENCOUNTER — Telehealth: Payer: Self-pay

## 2017-11-02 MED ORDER — OMEPRAZOLE 40 MG PO CPDR: 40.0000 mg | DELAYED_RELEASE_CAPSULE | Freq: Two times a day (BID) | ORAL | 3 refills | Status: DC

## 2017-11-02 NOTE — Telephone Encounter (Signed)
This note is attached to the wrong pt.  Please disregard

## 2017-11-02 NOTE — Telephone Encounter (Signed)
Line busy

## 2017-11-02 NOTE — Telephone Encounter (Signed)
-----   Message from Timothy Lasso, RN sent at 11/01/2017 10:09 AM EDT ----- Regarding: FW: Please call 5/17./19   ----- Message ----- From: Levin Erp, PA Sent: 11/01/2017  10:03 AM To: Timothy Lasso, RN Subject: Please call 5/17./19                           Please call tomorrow and check on patient. Constipated, will likely take PlenVu prep tonight after results of KUB. Thanks-JLL

## 2017-11-06 ENCOUNTER — Telehealth: Payer: Self-pay | Admitting: Emergency Medicine

## 2017-11-06 NOTE — Telephone Encounter (Signed)
Received fax from pharmacy insurance will not cover Omeprazole 40 mg bid. Initiated prior authorization on [email or web address]

## 2017-12-04 ENCOUNTER — Other Ambulatory Visit: Payer: Self-pay | Admitting: Physician Assistant

## 2017-12-05 ENCOUNTER — Telehealth: Payer: Self-pay | Admitting: Physician Assistant

## 2017-12-05 ENCOUNTER — Other Ambulatory Visit: Payer: Self-pay | Admitting: Physician Assistant

## 2017-12-05 MED ORDER — PANTOPRAZOLE SODIUM 40 MG PO TBEC: 40.0000 mg | DELAYED_RELEASE_TABLET | Freq: Two times a day (BID) | ORAL | 3 refills | Status: DC

## 2017-12-05 NOTE — Telephone Encounter (Signed)
Melanie Powell,   You wanted to start this lady on Omeprazole but it looks like her insurance will only cover pantoprazole. Please advise.

## 2017-12-05 NOTE — Telephone Encounter (Signed)
Spoke to patient and she states she wants to take Pantoprazole and she is doing well on it. Sent in Rx for twice a day and informed patient to take it twice a day since Omeprazole was not approved.

## 2017-12-05 NOTE — Telephone Encounter (Signed)
Pt needs rf for pantoprazole. She states that she requested a rf through her pharmacy but we denied it. She wants to know the reason. [facility] uses [facility] on [address].

## 2017-12-05 NOTE — Telephone Encounter (Signed)
Pantoprazole is fine... We were trying to increase to BID I believe. Thanks-JLL

## 2017-12-06 ENCOUNTER — Telehealth: Payer: Self-pay | Admitting: Physician Assistant

## 2017-12-06 NOTE — Telephone Encounter (Signed)
Do they cover any PPI BID? Thanks-JLL

## 2017-12-06 NOTE — Telephone Encounter (Signed)
Spoke to patients insurance they do not cover any PPIs twice a day and we will need to do an appeal. They will fax over the paperwork. They will cover Omeprazole 20 mg, lanzoprazole, nexium and aciphex once daily.

## 2017-12-06 NOTE — Telephone Encounter (Signed)
Please advise patients insurance will not cover Pantoprazole twice a day.

## 2017-12-06 NOTE — Telephone Encounter (Signed)
Pt called to inform that her insurance no longer covers pantoprazole. She is requesting something similar.

## 2017-12-07 NOTE — Telephone Encounter (Signed)
Called patient to update her and let her know we will appeal with insurance company. She stated she has been feeling ill and went ahead and got a one month supply out of pocket. I will let her know when we get a decision.

## 2017-12-07 NOTE — Telephone Encounter (Signed)
Ok then we shall appeal. Thanks-JLL

## 2017-12-14 NOTE — Telephone Encounter (Signed)
Left message on patients voicemail to call office.   

## 2017-12-14 NOTE — Telephone Encounter (Signed)
Have her take her Pantoprazole 40mg  qd and then send some Ranitidine 300mg  qd in for her to take before dinner. We can also send in GI cocktail 5-10 ml q4h for her as well. Tell her sorry- she can also buy bulk Omeprazole at [facility] or something (20mg ) and take two tabs before dinner instead if she wishes.  Thanks-JLL

## 2017-12-14 NOTE — Telephone Encounter (Signed)
My appeal request for Pantoprazole twice a day has been denied. Please advise.

## 2017-12-17 MED ORDER — AMBULATORY NON FORMULARY MEDICATION: 1 refills | Status: DC

## 2017-12-17 MED ORDER — RANITIDINE HCL 300 MG PO TABS: 300.0000 mg | ORAL_TABLET | Freq: Every day | ORAL | 2 refills | Status: DC

## 2017-12-17 NOTE — Telephone Encounter (Signed)
Patient informed and verbalized understanding. She states she prefers to take Pantoprazole once daily and ranitidine 300 mg and gi cocktail as needed for breakthrough symptoms.

## 2017-12-26 ENCOUNTER — Other Ambulatory Visit: Payer: Self-pay

## 2017-12-26 ENCOUNTER — Ambulatory Visit (AMBULATORY_SURGERY_CENTER): Payer: BLUE CROSS/BLUE SHIELD | Admitting: Gastroenterology

## 2017-12-26 ENCOUNTER — Encounter: Payer: Self-pay | Admitting: Gastroenterology

## 2017-12-26 VITALS — BP 125/68 | HR 69 | Temp 99.6°F | Resp 12 | Ht 63.0 in | Wt 139.0 lb

## 2017-12-26 DIAGNOSIS — K3189 Other diseases of stomach and duodenum: Secondary | ICD-10-CM

## 2017-12-26 DIAGNOSIS — R1310 Dysphagia, unspecified: Secondary | ICD-10-CM | POA: Diagnosis not present

## 2017-12-26 DIAGNOSIS — R1013 Epigastric pain: Secondary | ICD-10-CM

## 2017-12-26 DIAGNOSIS — K2970 Gastritis, unspecified, without bleeding: Secondary | ICD-10-CM

## 2017-12-26 MED ORDER — SODIUM CHLORIDE 0.9 % IV SOLN: 500.0000 mL | Freq: Once | INTRAVENOUS | Status: DC

## 2017-12-26 NOTE — Progress Notes (Signed)
Report to PACU, RN, vss, BBS= Clear.  

## 2017-12-26 NOTE — Patient Instructions (Signed)
Continue present medications.   YOU HAD AN ENDOSCOPIC PROCEDURE TODAY AT THE [facility]:   Refer to the procedure report that was given to you for any specific questions about what was found during the examination.  If the procedure report does not answer your questions, please call your gastroenterologist to clarify.  If you requested that your care partner not be given the details of your procedure findings, then the procedure report has been included in a sealed envelope for you to review at your convenience later.  YOU SHOULD EXPECT: Some feelings of bloating in the abdomen. Passage of more gas than usual.  Walking can help get rid of the air that was put into your GI tract during the procedure and reduce the bloating. If you had a lower endoscopy (such as a colonoscopy or flexible sigmoidoscopy) you may notice spotting of blood in your stool or on the toilet paper. If you underwent a bowel prep for your procedure, you may not have a normal bowel movement for a few days.  Please Note:  You might notice some irritation and congestion in your nose or some drainage.  This is from the oxygen used during your procedure.  There is no need for concern and it should clear up in a day or so.  SYMPTOMS TO REPORT IMMEDIATELY:     Following upper endoscopy (EGD)  Vomiting of blood or coffee ground material  New chest pain or pain under the shoulder blades  Painful or persistently difficult swallowing  New shortness of breath  Fever of 100F or higher  Black, tarry-looking stools  For urgent or emergent issues, a gastroenterologist can be reached at any hour by calling [phone].   DIET:  We do recommend a small meal at first, but then you may proceed to your regular diet.  Drink plenty of fluids but you should avoid alcoholic beverages for 24 hours.  ACTIVITY:  You should plan to take it easy for the rest of today and you should NOT DRIVE or use heavy machinery until tomorrow  (because of the sedation medicines used during the test).    FOLLOW UP: Our staff will call the number listed on your records the next business day following your procedure to check on you and address any questions or concerns that you may have regarding the information given to you following your procedure. If we do not reach you, we will leave a message.  However, if you are feeling well and you are not experiencing any problems, there is no need to return our call.  We will assume that you have returned to your regular daily activities without incident.  If any biopsies were taken you will be contacted by phone or by letter within the next 1-3 weeks.  Please call us at [phone] if you have not heard about the biopsies in 3 weeks.    SIGNATURES/CONFIDENTIALITY: You and/or your care partner have signed paperwork which will be entered into your electronic medical record.  These signatures attest to the fact that that the information above on your After Visit Summary has been reviewed and is understood.  Full responsibility of the confidentiality of this discharge information lies with you and/or your care-partner. 

## 2017-12-26 NOTE — Op Note (Signed)
[place] Patient Name: Melanie Powell Procedure Date: 12/26/2017 3:28 PM MRN: [id number] Endoscopist: Milus Banister , MD Age: 47 Referring MD:  Date of Birth: 13-Sep-1970 Gender: Female Account #: [id number] Procedure:                Upper GI endoscopy Indications:              Dysphagia, Heartburn Medicines:                Monitored Anesthesia Care Procedure:                Pre-Anesthesia Assessment:                           - Prior to the procedure, a History and Physical                            was performed, and patient medications and                            allergies were reviewed. The patient's tolerance of                            previous anesthesia was also reviewed. The risks                            and benefits of the procedure and the sedation                            options and risks were discussed with the patient.                            All questions were answered, and informed consent                            was obtained. Prior Anticoagulants: The patient has                            taken no previous anticoagulant or antiplatelet                            agents. ASA Grade Assessment: II - A patient with                            mild systemic disease. After reviewing the risks                            and benefits, the patient was deemed in                            satisfactory condition to undergo the procedure.                           After obtaining informed consent, the endoscope was  passed under direct vision. Throughout the                            procedure, the patient's blood pressure, pulse, and                            oxygen saturations were monitored continuously. The                            Endoscope was introduced through the mouth, and                            advanced to the second part of duodenum. The upper                            GI endoscopy was accomplished  without difficulty.                            The patient tolerated the procedure well. Scope In: Scope Out: Findings:                 Mild inflammation characterized by erythema and                            granularity was found in the gastric antrum.                            Biopsies were taken with a cold forceps for                            histology.                           The exam was otherwise without abnormality. Complications:            No immediate complications. Estimated blood loss:                            None. Estimated Blood Loss:     Estimated blood loss: none. Impression:               - Mild gastritis, biopsied to check for H. pylori.                           - The examination was otherwise normal. Recommendation:           - Patient has a contact number available for                            emergencies. The signs and symptoms of potential                            delayed complications were discussed with the                            patient. Return to normal activities tomorrow.  Written discharge instructions were provided to the                            patient.                           - Resume previous diet.                           - Continue present medications. Protonix should be                            taken 20-30 minutes before breakfast meal. Zantac                            (ranitidine) should be taken at bedtime.                           - Await pathology results. Milus Banister, MD 12/26/2017 3:43:26 PM This report has been signed electronically.

## 2017-12-26 NOTE — Progress Notes (Signed)
Called to room to assist during endoscopic procedure.  Patient ID and intended procedure confirmed with present staff. Received instructions for my participation in the procedure from the performing physician.  

## 2017-12-27 ENCOUNTER — Telehealth: Payer: Self-pay | Admitting: *Deleted

## 2017-12-27 ENCOUNTER — Other Ambulatory Visit: Payer: Self-pay | Admitting: Emergency Medicine

## 2017-12-27 MED ORDER — RANITIDINE HCL 300 MG PO TABS: 300.0000 mg | ORAL_TABLET | Freq: Every day | ORAL | 2 refills | Status: DC

## 2017-12-27 NOTE — Telephone Encounter (Signed)
  Follow up Call-  No flowsheet data found. [id number]  Patient questions:  Do you have a fever, pain , or abdominal swelling? No. Pain Score  0 *  Have you tolerated food without any problems? Yes.    Have you been able to return to your normal activities? Yes.    Do you have any questions about your discharge instructions: Diet   No. Medications  No. Follow up visit  No.  Do you have questions or concerns about your Care? No.  Actions: * If pain score is 4 or above: No action needed, pain <4.

## 2018-01-01 ENCOUNTER — Encounter: Payer: Self-pay | Admitting: Gastroenterology

## 2018-01-02 ENCOUNTER — Other Ambulatory Visit: Payer: Self-pay | Admitting: Physician Assistant

## 2018-03-18 ENCOUNTER — Other Ambulatory Visit: Payer: Self-pay | Admitting: Physician Assistant

## 2018-04-02 ENCOUNTER — Other Ambulatory Visit: Payer: Self-pay | Admitting: Physician Assistant

## 2018-04-02 DIAGNOSIS — Z1231 Encounter for screening mammogram for malignant neoplasm of breast: Secondary | ICD-10-CM

## 2018-04-18 ENCOUNTER — Emergency Department (HOSPITAL_COMMUNITY)
Admission: EM | Admit: 2018-04-18 | Discharge: 2018-04-18 | Discharge status: Absent without leave | Payer: BLUE CROSS/BLUE SHIELD

## 2018-04-18 ENCOUNTER — Other Ambulatory Visit: Payer: Self-pay

## 2018-04-19 ENCOUNTER — Other Ambulatory Visit: Payer: Self-pay | Admitting: Physician Assistant

## 2018-05-02 ENCOUNTER — Telehealth: Payer: Self-pay | Admitting: Physician Assistant

## 2018-05-02 NOTE — Telephone Encounter (Signed)
Pt calling again regarding rf for [facility] [address]. Pt also inquiring if she could take something OTC in the meantime, pls call her.

## 2018-05-04 ENCOUNTER — Other Ambulatory Visit: Payer: Self-pay | Admitting: Physician Assistant

## 2018-05-06 NOTE — Telephone Encounter (Signed)
Spoke to patient, she states she has been taking Pantoprazole and ranitidine as prescribed and only uses GI cocktail as needed but last week she had a couple of "rough episodes" of GERD. Rx sent to pharmacy and patient made aware.

## 2018-05-21 ENCOUNTER — Other Ambulatory Visit: Payer: Self-pay | Admitting: Physician Assistant

## 2018-05-23 ENCOUNTER — Telehealth: Payer: Self-pay | Admitting: Physician Assistant

## 2018-05-23 MED ORDER — PANTOPRAZOLE SODIUM 40 MG PO TBEC: 40.0000 mg | DELAYED_RELEASE_TABLET | Freq: Every day | ORAL | 3 refills | Status: DC

## 2018-05-23 NOTE — Telephone Encounter (Signed)
Rx sent to pharmacy   

## 2018-06-25 ENCOUNTER — Other Ambulatory Visit: Payer: Self-pay | Admitting: Physician Assistant

## 2018-06-27 ENCOUNTER — Telehealth: Payer: Self-pay | Admitting: *Deleted

## 2018-06-27 ENCOUNTER — Other Ambulatory Visit: Payer: Self-pay | Admitting: *Deleted

## 2018-06-27 MED ORDER — FAMOTIDINE 20 MG PO TABS: 20.0000 mg | ORAL_TABLET | Freq: Two times a day (BID) | ORAL | 6 refills | Status: DC

## 2018-06-27 NOTE — Telephone Encounter (Signed)
Called patient to advise that we are not doing Zantac anymore. There has been a warning on ir from the company.  We are replacing it with Pepcid so the Zantac 150 mg is equivalent to Pepcid 20 mg. She can take that twice daily . She was in agreement with that and I [facility] to [facility] [place], [address].

## 2018-08-26 ENCOUNTER — Other Ambulatory Visit: Payer: Self-pay

## 2018-08-26 MED ORDER — FAMOTIDINE 20 MG PO TABS: 20.0000 mg | ORAL_TABLET | Freq: Two times a day (BID) | ORAL | 1 refills | Status: DC

## 2018-08-26 NOTE — Telephone Encounter (Signed)
Refilled Pepcid with a 90 day supply per patient's request

## 2018-09-21 ENCOUNTER — Other Ambulatory Visit: Payer: Self-pay | Admitting: Physician Assistant

## 2018-09-23 ENCOUNTER — Other Ambulatory Visit: Payer: Self-pay | Admitting: Gastroenterology

## 2018-09-23 ENCOUNTER — Other Ambulatory Visit: Payer: Self-pay

## 2019-02-03 ENCOUNTER — Other Ambulatory Visit: Payer: Self-pay | Admitting: Gastroenterology

## 2019-02-06 DIAGNOSIS — S86319A Strain of muscle(s) and tendon(s) of peroneal muscle group at lower leg level, unspecified leg, initial encounter: Secondary | ICD-10-CM | POA: Insufficient documentation

## 2019-02-06 DIAGNOSIS — M79673 Pain in unspecified foot: Secondary | ICD-10-CM | POA: Insufficient documentation

## 2019-02-06 DIAGNOSIS — M722 Plantar fascial fibromatosis: Secondary | ICD-10-CM | POA: Insufficient documentation

## 2019-02-26 ENCOUNTER — Other Ambulatory Visit: Payer: Self-pay | Admitting: Physician Assistant

## 2019-02-27 ENCOUNTER — Ambulatory Visit: Payer: BC Managed Care – PPO | Admitting: Podiatry

## 2019-03-10 ENCOUNTER — Ambulatory Visit (INDEPENDENT_AMBULATORY_CARE_PROVIDER_SITE_OTHER): Payer: BC Managed Care – PPO

## 2019-03-10 ENCOUNTER — Encounter: Payer: Self-pay | Admitting: Podiatry

## 2019-03-10 ENCOUNTER — Ambulatory Visit (INDEPENDENT_AMBULATORY_CARE_PROVIDER_SITE_OTHER): Payer: BC Managed Care – PPO | Admitting: Podiatry

## 2019-03-10 ENCOUNTER — Other Ambulatory Visit: Payer: Self-pay

## 2019-03-10 ENCOUNTER — Other Ambulatory Visit: Payer: Self-pay | Admitting: Podiatry

## 2019-03-10 VITALS — BP 134/91 | HR 80 | Resp 16

## 2019-03-10 DIAGNOSIS — M722 Plantar fascial fibromatosis: Secondary | ICD-10-CM | POA: Diagnosis not present

## 2019-03-10 DIAGNOSIS — M779 Enthesopathy, unspecified: Secondary | ICD-10-CM

## 2019-03-10 DIAGNOSIS — M79671 Pain in right foot: Secondary | ICD-10-CM

## 2019-03-10 DIAGNOSIS — E785 Hyperlipidemia, unspecified: Secondary | ICD-10-CM | POA: Insufficient documentation

## 2019-03-10 NOTE — Patient Instructions (Signed)

## 2019-03-10 NOTE — Progress Notes (Signed)
   Subjective:    Patient ID: Melanie Powell, female    DOB: [birth year], 48 y.o.   MRN: [id number]  HPI    Review of Systems  All other systems reviewed and are negative.      Objective:   Physical Exam        Assessment & Plan:

## 2019-03-10 NOTE — Progress Notes (Signed)
Subjective:   Patient ID: Melanie Powell, female   DOB: 48 y.o.   MRN: [id number]   HPI Patient presents with pain on the bottom of both heels stating that it is been going on for several years and he is previous injections immobilization anti-inflammatories and MRI apparently describes it as a tear-like environment of the plantar fascial with large spur formation and states it is very inflamed and she walks differently.  Patient is a smoker has been smoking 1 pack a day now for 22 years and would like to be active if possible   Review of Systems  All other systems reviewed and are negative.       Objective:  Physical Exam Vitals signs and nursing note reviewed.  Constitutional:      Appearance: She is well-developed.  Pulmonary:     Effort: Pulmonary effort is normal.  Musculoskeletal: Normal range of motion.  Skin:    General: Skin is warm.  Neurological:     Mental Status: She is alert.     Neurovascular status found to be intact muscle strength was noted to be adequate range of motion within normal limits.  Patient is found to have discomfort in the plantar fascia bilateral with inflammation fluid and on the right foot, quite a bit of pain on the outside of the right foot around the fifth metatarsal base and distal to this which I am hoping is compensatory and its mechanism     Assessment:  Plantar fasciitis acute with long-term nature of this bilateral along with hopeful compensation tendinitis right     Plan:  H&P discussed conditions and at this point I did inject the fascia bilateral 3 mg Kenalog 5 mg Xylocaine applied fascial brace bilateral and instructed on support shoes.  We will see this patient back again in 2 weeks and reevaluate and may require endoscopic surgery  X-ray indicates there is plantar spur formation on the heel region bilateral

## 2019-03-17 ENCOUNTER — Other Ambulatory Visit: Payer: Self-pay | Admitting: Physician Assistant

## 2019-03-25 ENCOUNTER — Other Ambulatory Visit: Payer: Self-pay | Admitting: Physician Assistant

## 2019-03-26 ENCOUNTER — Other Ambulatory Visit: Payer: Self-pay | Admitting: Physician Assistant

## 2019-03-27 ENCOUNTER — Other Ambulatory Visit: Payer: Self-pay | Admitting: Physician Assistant

## 2019-03-30 ENCOUNTER — Other Ambulatory Visit: Payer: Self-pay | Admitting: Physician Assistant

## 2019-03-31 ENCOUNTER — Ambulatory Visit (INDEPENDENT_AMBULATORY_CARE_PROVIDER_SITE_OTHER): Payer: BC Managed Care – PPO | Admitting: Podiatry

## 2019-03-31 ENCOUNTER — Other Ambulatory Visit: Payer: Self-pay

## 2019-03-31 ENCOUNTER — Encounter: Payer: Self-pay | Admitting: Podiatry

## 2019-03-31 DIAGNOSIS — M722 Plantar fascial fibromatosis: Secondary | ICD-10-CM

## 2019-03-31 NOTE — Patient Instructions (Signed)
Pre-Operative Instructions  Congratulations, you have decided to take an important step towards improving your quality of life.  You can be assured that the doctors and staff at [facility] will be with you every step of the way.  Here are some important things you should know:  1. Plan to be at the surgery center/hospital at least 1 (one) hour prior to your scheduled time, unless otherwise directed by the surgical center/hospital staff.  You must have a responsible adult accompany you, remain during the surgery and drive you home.  Make sure you have directions to the surgical center/hospital to ensure you arrive on time. 2. If you are having surgery at [facility] or [facility] hospitals, you will need a copy of your medical history and physical form from your family physician within one month prior to the date of surgery. We will give you a form for your primary physician to complete.  3. We make every effort to accommodate the date you request for surgery.  However, there are times where surgery dates or times have to be moved.  We will contact you as soon as possible if a change in schedule is required.   4. No aspirin/ibuprofen for one week before surgery.  If you are on aspirin, any non-steroidal anti-inflammatory medications (Mobic, Aleve, Ibuprofen) should not be taken seven (7) days prior to your surgery.  You make take Tylenol for pain prior to surgery.  5. Medications - If you are taking daily heart and blood pressure medications, seizure, reflux, allergy, asthma, anxiety, pain or diabetes medications, make sure you notify the surgery center/hospital before the day of surgery so they can tell you which medications you should take or avoid the day of surgery. 6. No food or drink after midnight the night before surgery unless directed otherwise by surgical center/hospital staff. 7. No alcoholic beverages 24-hours prior to surgery.  No smoking 24-hours prior or 24-hours after  surgery. 8. Wear loose pants or shorts. They should be loose enough to fit over bandages, boots, and casts. 9. Don't wear slip-on shoes. Sneakers are preferred. 10. Bring your boot with you to the surgery center/hospital.  Also bring crutches or a walker if your physician has prescribed it for you.  If you do not have this equipment, it will be provided for you after surgery. 11. If you have not been contacted by the surgery center/hospital by the day before your surgery, call to confirm the date and time of your surgery. 12. Leave-time from work may vary depending on the type of surgery you have.  Appropriate arrangements should be made prior to surgery with your employer. 13. Prescriptions will be provided immediately following surgery by your doctor.  Fill these as soon as possible after surgery and take the medication as directed. Pain medications will not be refilled on weekends and must be approved by the doctor. 14. Remove nail polish on the operative foot and avoid getting pedicures prior to surgery. 15. Wash the night before surgery.  The night before surgery wash the foot and leg well with water and the antibacterial soap provided. Be sure to pay special attention to beneath the toenails and in between the toes.  Wash for at least three (3) minutes. Rinse thoroughly with water and dry well with a towel.  Perform this wash unless told not to do so by your physician.  Enclosed: 1 Ice pack (please put in freezer the night before surgery)   1 Hibiclens skin cleaner     Pre-op instructions  If you have any questions regarding the instructions, please do not hesitate to call our office.  [place]: [address], [place], [place] [address] -- [phone]  [place]: [address] [place], [place] [address] -- [phone]  [place]: [address]  [place],  [address] -- [phone]   Website: [email or web address] 

## 2019-03-31 NOTE — Progress Notes (Signed)
Subjective:   Patient ID: Melanie Powell, female   DOB: 48 y.o.   MRN: [id number]   HPI Patient states the injection did not help and this pain is awful in my heel right over left and it has been going on for years and is very frustrating due to the amount of pain I am having   ROS      Objective:  Physical Exam  Neurovascular status intact with patient found to have exquisite discomfort medial fascial band heel right over left with inflammation fluid around the medial band     Assessment:  Acute plantar fasciitis right over left its not responded to what I did and is not responded to previous immobilization injections anti-inflammatories     Plan:  H&P conditions reviewed and today I went ahead and I discussed treatment options and she is opted for surgical intervention.  Patient wants surgery and I allowed her to go over consent form reviewing alternative treatments complications she is willing to accept risk of surgery and at this point after extensive review signed consent form.  Patient understands no guarantee as far as success and swelling to undergo procedure and signed consent form and does understand the possibilities of arch pain lateral foot pain which can occur with procedure such as this along with everything else in the total recovery can take 6 months to 1 year.  Dispensed air fracture walker with all instructions on usage and patient is encouraged to call with any question she may have prior to procedure

## 2019-04-07 ENCOUNTER — Telehealth: Payer: Self-pay | Admitting: Podiatry

## 2019-04-07 ENCOUNTER — Other Ambulatory Visit: Payer: Self-pay

## 2019-04-07 MED ORDER — PANTOPRAZOLE SODIUM 40 MG PO TBEC: 40.0000 mg | DELAYED_RELEASE_TABLET | Freq: Every day | ORAL | 1 refills | Status: DC

## 2019-04-07 NOTE — Telephone Encounter (Signed)
Pantoprazole refilled and noted to call for appointment.

## 2019-04-07 NOTE — Telephone Encounter (Addendum)
DOS: 04/29/2019 SURGICAL PROCEDURE: (EPF) Endoscopic Plantar Fasciotomy Med Band Rt CPT CODE: 23557 DX CODE: M72.2 Plantar Fasciitis  Member Number: [id number]  Policy Effective : [id number]  -  06/18/9998   Name: Melanie Powell  Date of Birth: [birth year]  Member Liability Summary       In-Network   Max Per Benefit Period Year-to-Date Remaining     CoInsurance         Deductible $2,800.00 $0.00     Out-Of-Pocket 3 $5,000.00 $1,188.93 3 Out-of-Pocket includes copay, deductible, and coinsurance.   Hospital - Ambulatory Surgical      In-Network Copay Coinsurance Authorization Required Not Applicable [id number]  Yes Benefit:  03/20/2019 - 06/18/9998 Utilization Management Organization: [employer] Telephone:  [phone].  No Prior Authorization or Referrals are Required per Sharyn Lull. Ref# [id number].

## 2019-04-09 ENCOUNTER — Telehealth: Payer: Self-pay | Admitting: Podiatry

## 2019-04-09 NOTE — Telephone Encounter (Signed)
Tried calling pt about her surgery time. Phone just rang with no answer/answering machine.

## 2019-04-09 NOTE — Telephone Encounter (Signed)
I was scheduled for surgery on 04/29/19 with Dr. Paulla Dolly about two weeks ago. I was supposed to have been given a call about the time but I have not been called.

## 2019-04-21 ENCOUNTER — Telehealth: Payer: Self-pay | Admitting: Podiatry

## 2019-04-21 NOTE — Telephone Encounter (Signed)
I'm supposed to have surgery next week. What center do you use to do the surgery itself?

## 2019-04-21 NOTE — Telephone Encounter (Signed)
Called and told pt she would be having surgery at [facility]). I told her there should have been a brochure in the bag along with the scrub brush and ice pack. Pt stated she didn't receive one but someone called the other day and their power went out and their phone just started working. I gave her the phone number to the surgical center. I also re-informed her that they would call her either this Friday or next Monday to let her know what time to arrive for her surgery.

## 2019-04-28 ENCOUNTER — Encounter: Payer: Self-pay | Admitting: Gastroenterology

## 2019-04-28 ENCOUNTER — Telehealth: Payer: Self-pay | Admitting: Podiatry

## 2019-04-28 ENCOUNTER — Ambulatory Visit (INDEPENDENT_AMBULATORY_CARE_PROVIDER_SITE_OTHER): Payer: BC Managed Care – PPO | Admitting: Gastroenterology

## 2019-04-28 VITALS — BP 120/74 | HR 66 | Temp 97.8°F | Ht 63.0 in | Wt 130.0 lb

## 2019-04-28 DIAGNOSIS — K219 Gastro-esophageal reflux disease without esophagitis: Secondary | ICD-10-CM | POA: Insufficient documentation

## 2019-04-28 MED ORDER — FAMOTIDINE 20 MG PO TABS: 20.0000 mg | ORAL_TABLET | Freq: Two times a day (BID) | ORAL | 4 refills | Status: DC | PRN

## 2019-04-28 MED ORDER — PANTOPRAZOLE SODIUM 40 MG PO TBEC: 40.0000 mg | DELAYED_RELEASE_TABLET | Freq: Every day | ORAL | 4 refills | Status: DC

## 2019-04-28 NOTE — Patient Instructions (Addendum)
You have been given a separate informational sheet regarding your tobacco use, the importance of quitting and local resources to help you quit.  If you are age 46 or older, your body mass index should be between 23-30. Your Body mass index is 23.03 kg/m. If this is out of the aforementioned range listed, please consider follow up with your Primary Care Provider.  If you are age 76 or younger, your body mass index should be between 19-25. Your Body mass index is 23.03 kg/m. If this is out of the aformentioned range listed, please consider follow up with your Primary Care Provider.   We have sent the following medications to your pharmacy for you to pick up at your convenience:  Pantoprazole- try taking medication in the evening 30-60 mins before dinner  Pepcid   Thank you for choosing me and [facility]  Alonza Bogus, PA-C

## 2019-04-28 NOTE — Progress Notes (Signed)
04/28/2019 Melanie Powell [id number] May 25, 1971   HISTORY OF PRESENT ILLNESS: This is a 48 year old female who is a patient of Dr. Ardis Hughs.  She follows here for reflux related symptoms.  She takes pantoprazole 40 mg each day in the mornings and uses Pepcid 20 mg twice daily as needed.  She says that overall the pantoprazole works well, although she feels like maybe it does not help quite as much as it used to.  Like I said, she takes it in the morning, but then does not eat breakfast so may not be getting full effect.  She says that symptoms tend to be worse in the evenings or towards nighttime.  She had EGD in July 2019, which showed gastritis.  Gastric biopsy showed mild reactive gastropathy and hyperemia, negative for H. pylori.  Past Medical History:  Diagnosis Date  . Anxiety   . Hypercholesteremia   . Hypertension   . Migraine   . SVD (spontaneous vaginal delivery)    x 4   Past Surgical History:  Procedure Laterality Date  . CARPAL TUNNEL RELEASE Right 03/15/2017   Procedure: Right CARPAL TUNNEL RELEASE;  Surgeon: Leanora Cover, MD;  Location: [facility];  Service: Orthopedics;  Laterality: Right;  Bier Block  . LAPAROSCOPIC ASSISTED VAGINAL HYSTERECTOMY N/A 09/11/2013   Procedure: LAPAROSCOPIC ASSISTED VAGINAL HYSTERECTOMY with bilateral salpingectomy and right labial biopsy with posterior fourchette;  Surgeon: Linda Hedges, DO;  Location: [facility];  Service: Gynecology;  Laterality: N/A;  . 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 She has a 22.00 pack-year smoking history. She has never used smokeless tobacco. She reports previous alcohol use. She reports that she does not use drugs. family history includes Brain cancer in her mother; Breast cancer in her sister; Heart failure in her father; Ovarian cancer in her maternal aunt;  Parkinson's disease in her brother, father, and paternal grandmother. Allergies  Allergen Reactions  . Azithromycin       Outpatient Encounter Medications as of 04/28/2019  Medication Sig  . famotidine (PEPCID) 20 MG tablet Take 1 tablet (20 mg total) by mouth 2 (two) times daily as needed. Please schedule a follow up appt for further refills.  Last seen 10-2017. Thank you  . gabapentin (NEURONTIN) 600 MG tablet Take 600 mg by mouth 3 (three) times daily.  . metoprolol tartrate (LOPRESSOR) 50 MG tablet Take 50 mg by mouth 2 (two) times daily.  . pantoprazole (PROTONIX) 40 MG tablet Take 1 tablet (40 mg total) by mouth daily. Please schedule a yearly follow up with Dr. Ardis Hughs or Ellouise Newer for further refills. Thank you  . rosuvastatin (CRESTOR) 20 MG tablet Take 20 mg by mouth daily.  Marland Kitchen zolpidem (AMBIEN) 5 MG tablet Take 5 mg by mouth daily.  . [DISCONTINUED] 0.9 %  sodium chloride infusion    No facility-administered encounter medications on file as of 04/28/2019.      REVIEW OF SYSTEMS  : All other systems reviewed and negative except where noted in the History of Present Illness.   PHYSICAL EXAM: BP 120/74   Pulse 66   Temp 97.8 F (36.6 C)   Ht 5\' 3"  (1.6 m)   Wt 130 lb (59 kg)   LMP 08/19/2013   BMI 23.03 kg/m  General: Well developed white female  in no acute distress Head: Normocephalic and atraumatic Eyes:  Sclerae anicteric, conjunctiva pink. Ears: Normal auditory acuity Lungs: Clear throughout to auscultation; no increased WOB. Heart: Regular rate and rhythm; no M/R/G. Abdomen: Soft, non-distended.  BS present.  Non-tender. Musculoskeletal: Symmetrical with no gross deformities  Skin: No lesions on visible extremities Extremities: No edema  Neurological: Alert oriented x 4, grossly non-focal Psychological:  Alert and cooperative. Normal mood and affect  ASSESSMENT AND PLAN: *GERD: Overall well controlled on pantoprazole 40 mg daily.  Feels like maybe it does  not work as well as it used to, but still does well overall.  Uses pepcid 20 mg BID prn in addition.  Will send a prescriptions for 90 day supplies to her pharmacy.  Suggested that she try taking the pantoprazole in the evenings instead, 30-60 minutes before dinner, to try to get optimal effect (says that she does not eat breakfast after taking it in the morning).   CC:  [place], [place], PA-C

## 2019-04-28 NOTE — Progress Notes (Signed)
I agree with the above note, plan 

## 2019-04-28 NOTE — Telephone Encounter (Signed)
Pt called wanting to know what surgical center she is to go to in the morning for her surgery. I told her [facility] and gave her their address and phone number.

## 2019-04-29 ENCOUNTER — Encounter: Payer: Self-pay | Admitting: Podiatry

## 2019-04-29 DIAGNOSIS — M722 Plantar fascial fibromatosis: Secondary | ICD-10-CM | POA: Diagnosis not present

## 2019-04-30 ENCOUNTER — Telehealth: Payer: Self-pay | Admitting: *Deleted

## 2019-04-30 NOTE — Telephone Encounter (Signed)
I spoke with pt's husband, Loreli Slot and informed that if the area of blood had dried, then he could mark the area, so if it began to bleed again they would know and could call for instructions, but the blood was hers and sterile, would provide splinting and protection.

## 2019-04-30 NOTE — Telephone Encounter (Signed)
Pt's husband, Loreli Slot called states pt had surgery with Dr. Paulla Dolly on 04/29/2019 and had bleeding yesterday and it is dry now, they just wanted to know it was okay.

## 2019-05-05 ENCOUNTER — Telehealth: Payer: Self-pay | Admitting: Podiatry

## 2019-05-05 ENCOUNTER — Other Ambulatory Visit: Payer: Self-pay | Admitting: Podiatry

## 2019-05-05 MED ORDER — HYDROCODONE-ACETAMINOPHEN 10-325 MG PO TABS: 1.0000 | ORAL_TABLET | Freq: Three times a day (TID) | ORAL | 0 refills | Status: AC | PRN

## 2019-05-05 NOTE — Progress Notes (Unsigned)
hyd

## 2019-05-05 NOTE — Telephone Encounter (Signed)
Sent to her

## 2019-05-05 NOTE — Telephone Encounter (Addendum)
I called pt and she states it seems to be worse at night, she has had to be up on it more the last couple of days, her husband had to go back to work and their son is autistic and she has to be up with him. I told pt to rest the best she could and I would inform Dr. Paulla Dolly and she could contact her pharmacy later this afternoon.

## 2019-05-05 NOTE — Telephone Encounter (Signed)
Patient called to say that her pain meds are getting low DOS was 04/29/19 wanted to know if she could get more

## 2019-05-09 ENCOUNTER — Ambulatory Visit (INDEPENDENT_AMBULATORY_CARE_PROVIDER_SITE_OTHER): Payer: BC Managed Care – PPO | Admitting: Podiatry

## 2019-05-09 ENCOUNTER — Other Ambulatory Visit: Payer: Self-pay

## 2019-05-09 ENCOUNTER — Encounter: Payer: Self-pay | Admitting: Podiatry

## 2019-05-09 ENCOUNTER — Telehealth: Payer: Self-pay | Admitting: Podiatry

## 2019-05-09 VITALS — BP 116/84 | HR 80 | Temp 98.4°F | Resp 16

## 2019-05-09 DIAGNOSIS — M722 Plantar fascial fibromatosis: Secondary | ICD-10-CM

## 2019-05-09 MED ORDER — HYDROCODONE-ACETAMINOPHEN 10-325 MG PO TABS: 1.0000 | ORAL_TABLET | Freq: Three times a day (TID) | ORAL | 0 refills | Status: AC | PRN

## 2019-05-09 MED ORDER — DOXYCYCLINE HYCLATE 100 MG PO TABS: 100.0000 mg | ORAL_TABLET | Freq: Two times a day (BID) | ORAL | 0 refills | Status: DC

## 2019-05-09 NOTE — Telephone Encounter (Signed)
DOS: 05/27/2019  SURGICAL PROCEDURE: Endoscopic Plantar Fasciotomy Med Band [id number])  [employer]  Policy Effective : [id number]  -  06/18/9998  Member Liability Summary       In-Network   Max Per Benefit Period Year-to-Date Remaining     CoInsurance         Deductible $2,800.00 $0.00     Out-Of-Pocket 3 $5,000.00 $548.08 3 Out-of-Pocket includes copay, deductible, and coinsurance.   Hospital - Ambulatory Surgical      In-Network Copay Coinsurance Authorization Required Not Applicable [id number]  Yes Benefit:  03/20/2019 - 06/18/9998 Utilization Management Organization: [employer]  Telephone:  [phone]  Per Charlena Cross P no prior authorization or referrals are required.   Call Reference # [id number].

## 2019-05-10 NOTE — Progress Notes (Signed)
Subjective:   Patient ID: Melanie Powell, female   DOB: 48 y.o.   MRN: [id number]   HPI Patient presents stating that of doing really well with his right 1 and I would like to get the left one fixed in the next few weeks if possible.  States that she has some discomfort but she is very pleased so far   ROS      Objective:  Physical Exam  Neurovascular status intact negative Homans site noted with wound edges coapted well on the medial lateral side of the right heel with minimal to moderate discomfort of the plantar fascia that really present mostly when she does too much standing.  Patient is found to have good digital perfusion and is well oriented x3     Assessment:  Plantar fascial surgery right that is doing well so far with good alignment noted stitches intact with on the left patient noted to have significant discomfort in the fascial band     Plan:  H&P reviewed both conditions and for the right I reapplied sterile dressing instructed on continued immobilization elevation compression and reappoint 2 weeks suture removal or earlier if any issues were to occur for the left recommended endoscopic surgery which will be done in the next 3 weeks

## 2019-05-13 ENCOUNTER — Telehealth: Payer: Self-pay | Admitting: Podiatry

## 2019-05-13 NOTE — Telephone Encounter (Addendum)
I called pt to get status of the pain in the surgery foot, and she states it hurts on and off she has pain and sharp pains in the night, and he had told her to call early before the holiday.

## 2019-05-13 NOTE — Telephone Encounter (Signed)
Pt called requesting a refill on her hydrocodone. She will run out tomorrow and wanted to make sure she requested the refill before we closed for the Holiday.   [facility] [address]

## 2019-05-14 ENCOUNTER — Other Ambulatory Visit: Payer: Self-pay | Admitting: Podiatry

## 2019-05-14 MED ORDER — HYDROCODONE-ACETAMINOPHEN 10-325 MG PO TABS: 1.0000 | ORAL_TABLET | ORAL | 0 refills | Status: AC | PRN

## 2019-05-14 NOTE — Telephone Encounter (Signed)
done

## 2019-05-20 ENCOUNTER — Telehealth: Payer: Self-pay | Admitting: *Deleted

## 2019-05-20 NOTE — Telephone Encounter (Signed)
Pt states she needs a pain medication, her pain is at a level 3 at this time.

## 2019-05-21 ENCOUNTER — Telehealth: Payer: Self-pay

## 2019-05-21 ENCOUNTER — Telehealth: Payer: Self-pay | Admitting: *Deleted

## 2019-05-21 DIAGNOSIS — M259 Joint disorder, unspecified: Secondary | ICD-10-CM | POA: Insufficient documentation

## 2019-05-21 MED ORDER — DICLOFENAC SODIUM 75 MG PO TBEC: 75.0000 mg | DELAYED_RELEASE_TABLET | Freq: Two times a day (BID) | ORAL | 0 refills | Status: DC

## 2019-05-21 NOTE — Telephone Encounter (Signed)
Pt called states she is still having pain and swelling at the suture line and would like a less strong pain medication than the hydrocodone.

## 2019-05-21 NOTE — Telephone Encounter (Signed)
Patient called back and informed us that she cannot tolerate Diclofenac due to stomach irritation. Please advise another medication. I left her a message to take Tylenol in the meantime, if she can tolerate it.

## 2019-05-21 NOTE — Telephone Encounter (Signed)
Can start her on diclofenac 75mg . Number 20

## 2019-05-21 NOTE — Telephone Encounter (Signed)
I informed pt of Dr. Mellody Drown orders for Diclofenac, an antiinflammatory pain medication for inflammation to the suture line and swelling and pain. Pt states understanding.

## 2019-05-22 MED ORDER — TRAMADOL HCL 50 MG PO TABS: 50.0000 mg | ORAL_TABLET | Freq: Three times a day (TID) | ORAL | 0 refills | Status: AC | PRN

## 2019-05-22 NOTE — Telephone Encounter (Signed)
Could try tramadol if she can tolerate

## 2019-05-22 NOTE — Telephone Encounter (Signed)
I informed pt of Dr. Mellody Drown change of orders to Tramadol and pt states that is fine, she has had it before. [phone]

## 2019-05-22 NOTE — Telephone Encounter (Signed)
[facility], pharmacist states she filled tramadol from pt's PCP yesterday and asked if our doctor would like to put our rx on hold and I told her yes.

## 2019-05-23 ENCOUNTER — Encounter: Payer: Self-pay | Admitting: Podiatry

## 2019-05-23 ENCOUNTER — Other Ambulatory Visit: Payer: Self-pay

## 2019-05-23 ENCOUNTER — Ambulatory Visit (INDEPENDENT_AMBULATORY_CARE_PROVIDER_SITE_OTHER): Payer: BC Managed Care – PPO | Admitting: Podiatry

## 2019-05-23 DIAGNOSIS — M722 Plantar fascial fibromatosis: Secondary | ICD-10-CM | POA: Diagnosis not present

## 2019-05-23 NOTE — Patient Instructions (Signed)
Pre-Operative Instructions  Congratulations, you have decided to take an important step towards improving your quality of life.  You can be assured that the doctors and staff at [facility] will be with you every step of the way.  Here are some important things you should know:  1. Plan to be at the surgery center/hospital at least 1 (one) hour prior to your scheduled time, unless otherwise directed by the surgical center/hospital staff.  You must have a responsible adult accompany you, remain during the surgery and drive you home.  Make sure you have directions to the surgical center/hospital to ensure you arrive on time. 2. If you are having surgery at [facility] or [facility], you will need a copy of your medical history and physical form from your family physician within one month prior to the date of surgery. We will give you a form for your primary physician to complete.  3. We make every effort to accommodate the date you request for surgery.  However, there are times where surgery dates or times have to be moved.  We will contact you as soon as possible if a change in schedule is required.   4. No aspirin/ibuprofen for one week before surgery.  If you are on aspirin, any non-steroidal anti-inflammatory medications (Mobic, Aleve, Ibuprofen) should not be taken seven (7) days prior to your surgery.  You make take Tylenol for pain prior to surgery.  5. Medications - If you are taking daily heart and blood pressure medications, seizure, reflux, allergy, asthma, anxiety, pain or diabetes medications, make sure you notify the surgery center/hospital before the day of surgery so they can tell you which medications you should take or avoid the day of surgery. 6. No food or drink after midnight the night before surgery unless directed otherwise by surgical center/hospital staff. 7. No alcoholic beverages 24-hours prior to surgery.  No smoking 24-hours prior or 24-hours after  surgery. 8. Wear loose pants or shorts. They should be loose enough to fit over bandages, boots, and casts. 9. Don't wear slip-on shoes. Sneakers are preferred. 10. Bring your boot with you to the surgery center/hospital.  Also bring crutches or a walker if your physician has prescribed it for you.  If you do not have this equipment, it will be provided for you after surgery. 11. If you have not been contacted by the surgery center/hospital by the day before your surgery, call to confirm the date and time of your surgery. 12. Leave-time from work may vary depending on the type of surgery you have.  Appropriate arrangements should be made prior to surgery with your employer. 13. Prescriptions will be provided immediately following surgery by your doctor.  Fill these as soon as possible after surgery and take the medication as directed. Pain medications will not be refilled on weekends and must be approved by the doctor. 14. Remove nail polish on the operative foot and avoid getting pedicures prior to surgery. 15. Wash the night before surgery.  The night before surgery wash the foot and leg well with water and the antibacterial soap provided. Be sure to pay special attention to beneath the toenails and in between the toes.  Wash for at least three (3) minutes. Rinse thoroughly with water and dry well with a towel.  Perform this wash unless told not to do so by your physician.  Enclosed: 1 Ice pack (please put in freezer the night before surgery)   1 Hibiclens skin cleaner     Pre-op instructions  If you have any questions regarding the instructions, please do not hesitate to call our office.  [place]: [address], [place], [place] [address] -- [phone]  [place]: [address] [place], [place] [address] -- [phone]  [place]: [address]  [place], [place] [address] -- [phone]   Website: [email or web address] 

## 2019-05-26 NOTE — Progress Notes (Signed)
Subjective:   Patient ID: Melanie Powell, female   DOB: 48 y.o.   MRN: [id number]   HPI Patient presents stating doing well with the right foot with stitches intact wound edges well coapted and for the left one is excited to get it fixed and wants to discuss   ROS      Objective:  Physical Exam  Neurovascular status intact negative Bevelyn Buckles' sign noted with right foot healing well wound edges well coapted minimal discomfort upon palpation with quite a bit of discomfort medial band left plantar fascia     Assessment:  Doing well after having endoscopic release medial band right fascia with exquisite discomfort left fascial medial band     Plan:  H&P reviewed both conditions and for the left I recommended endoscopic release reviewing procedure and allowed patient to read and then signed consent form after extensive review.  Patient understands no guarantee as far success of procedure is willing to accept risk and for the right we removed stitches applied sterile dressing continue compression immobilization elevation

## 2019-05-27 ENCOUNTER — Encounter: Payer: Self-pay | Admitting: Podiatry

## 2019-05-27 DIAGNOSIS — M722 Plantar fascial fibromatosis: Secondary | ICD-10-CM

## 2019-05-30 ENCOUNTER — Telehealth: Payer: Self-pay | Admitting: *Deleted

## 2019-05-30 ENCOUNTER — Other Ambulatory Visit: Payer: Self-pay | Admitting: *Deleted

## 2019-05-30 MED ORDER — HYDROCODONE-ACETAMINOPHEN 10-325 MG PO TABS: 2.0000 | ORAL_TABLET | Freq: Four times a day (QID) | ORAL | 0 refills | Status: DC | PRN

## 2019-05-30 NOTE — Telephone Encounter (Signed)
Pt states she is going to run out of pain medication on 06/01/2019 and needs a refill.

## 2019-05-30 NOTE — Addendum Note (Signed)
Addended by: Boneta Lucks on: 05/30/2019 02:03 PM   Modules accepted: Orders

## 2019-05-30 NOTE — Telephone Encounter (Signed)
Returned patient's phone call. Patient had surgery with Dr. Paulla Dolly on Tuesday, May 27, 2019.  DOS 05/27/19 EPF LT  Patient was down to their last 10 pain pills and was afraid that they would not have enough for the weekend. Patient asked for a refill.  I talked to Dr. Posey Pronto who did authorize a refill for:  Hydrocodone-Acetaminophen 10-325 mg Dispensed 15 with no refills  Patient has an appointment with Dr. Paulla Dolly on Monday, June 02, 2019 at 1:15 pm.

## 2019-06-02 ENCOUNTER — Other Ambulatory Visit: Payer: Self-pay

## 2019-06-02 ENCOUNTER — Ambulatory Visit (INDEPENDENT_AMBULATORY_CARE_PROVIDER_SITE_OTHER): Payer: BC Managed Care – PPO | Admitting: Podiatry

## 2019-06-02 ENCOUNTER — Encounter: Payer: Self-pay | Admitting: Podiatry

## 2019-06-02 VITALS — BP 129/85 | HR 101 | Temp 97.8°F | Resp 16

## 2019-06-02 DIAGNOSIS — M722 Plantar fascial fibromatosis: Secondary | ICD-10-CM

## 2019-06-02 MED ORDER — HYDROCODONE-ACETAMINOPHEN 10-325 MG PO TABS: 1.0000 | ORAL_TABLET | Freq: Three times a day (TID) | ORAL | 0 refills | Status: AC | PRN

## 2019-06-02 MED ORDER — DOXYCYCLINE HYCLATE 100 MG PO TABS: 100.0000 mg | ORAL_TABLET | Freq: Two times a day (BID) | ORAL | 0 refills | Status: DC

## 2019-06-02 NOTE — Progress Notes (Signed)
Subjective:   Patient ID: Melanie Powell, female   DOB: 48 y.o.   MRN: [id number]   HPI Patient states she is having a little bit of pain on the left and overall doing well   ROS      Objective:  Physical Exam  Neurovascular status intact negative Bevelyn Buckles' sign noted with incision sites healing well with slight discoloration around the medial incision site localized with no active drainage but mildly discomfort.  Patient does still have some discomfort in the plantar arch     Assessment:  Doing well overall with slight irritation of the incision site medial left with negative Bevelyn Buckles' sign noted     Plan:  As precautionary measure I am placing her on doxycycline twice daily and I went ahead applied sterile dressing instructed on continued elevation compression immobilization and did write for 15 more pain pills.  Reappoint to recheck

## 2019-06-10 ENCOUNTER — Telehealth: Payer: Self-pay | Admitting: *Deleted

## 2019-06-10 NOTE — Telephone Encounter (Signed)
Pt states she checked the pharmacy and the medication are not there.

## 2019-06-10 NOTE — Telephone Encounter (Signed)
Left message informing pt I had sent the medication requests to Dr. Paulla Dolly and would call again tomorrow.

## 2019-06-10 NOTE — Telephone Encounter (Signed)
Pt states she had surgery 2 weeks ago, left foot is red, swollen and puffy with a little bit of spotting on the dressing this morning and may have stretched a little too much and the incision site opened, now more painful in the boot. Pt states she is able to rest a little more. I offered pt an appt this week before Christmas, but pt states she is unable to come in before her appt 06/16/2019. Pt states she was given an antibiotic which she has completed but the area doesn't look any different, her PCP usually prescribes Amoxicillin, because she gets infections easily. Pt requested a refill of the norco also.

## 2019-06-11 MED ORDER — CEPHALEXIN 500 MG PO CAPS: 500.0000 mg | ORAL_CAPSULE | Freq: Three times a day (TID) | ORAL | 1 refills | Status: DC

## 2019-06-11 MED ORDER — HYDROCODONE-ACETAMINOPHEN 10-325 MG PO TABS: 1.0000 | ORAL_TABLET | Freq: Three times a day (TID) | ORAL | 0 refills | Status: AC | PRN

## 2019-06-11 NOTE — Telephone Encounter (Signed)
complete

## 2019-06-11 NOTE — Addendum Note (Signed)
Addended by: Wallene Huh on: 06/11/2019 10:40 AM   Modules accepted: Orders

## 2019-06-16 ENCOUNTER — Ambulatory Visit (INDEPENDENT_AMBULATORY_CARE_PROVIDER_SITE_OTHER): Payer: BC Managed Care – PPO | Admitting: Podiatry

## 2019-06-16 ENCOUNTER — Encounter: Payer: Self-pay | Admitting: Podiatry

## 2019-06-16 ENCOUNTER — Other Ambulatory Visit: Payer: Self-pay

## 2019-06-16 DIAGNOSIS — M722 Plantar fascial fibromatosis: Secondary | ICD-10-CM

## 2019-06-19 NOTE — Progress Notes (Signed)
Subjective:   Patient ID: Melanie Powell, female   DOB: 48 y.o.   MRN: [id number]   HPI Patient presents stating doing much better in the antibiotic to help with any redness that she had   ROS      Objective:  Physical Exam  Neurovascular status intact with patient's left incision sites medial lateral intact wound edges well coapted no drainage or redness noted currently with minimal plantar pain     Assessment:  Doing well overall post endoscopic release left with right when doing well normal wearing normal shoes     Plan:  Stitches removed left sterile dressings reapplied and compression stocking dispensed.  Begin gradual increased activity left and patient will be seen back for [place] to recheck as needed and will gradually increase activity levels at this time

## 2019-06-25 ENCOUNTER — Telehealth: Payer: Self-pay | Admitting: *Deleted

## 2019-06-25 MED ORDER — TRAMADOL HCL 50 MG PO TABS: 50.0000 mg | ORAL_TABLET | Freq: Four times a day (QID) | ORAL | 0 refills | Status: AC | PRN

## 2019-06-25 NOTE — Telephone Encounter (Signed)
Dr. Josephina Shih the refill of tramadol. I informed pt. Left [phone]

## 2019-06-25 NOTE — Telephone Encounter (Signed)
Pt states she was seen about a week ago and the sutures removed, is taking tylenol but it is not helping and she would like a refill of the tramadol.

## 2019-06-30 ENCOUNTER — Encounter: Payer: BC Managed Care – PPO | Admitting: Podiatry

## 2020-01-13 ENCOUNTER — Other Ambulatory Visit: Payer: Self-pay | Admitting: Orthopedic Surgery

## 2020-01-16 ENCOUNTER — Other Ambulatory Visit: Payer: Self-pay

## 2020-01-16 ENCOUNTER — Encounter (HOSPITAL_BASED_OUTPATIENT_CLINIC_OR_DEPARTMENT_OTHER): Payer: Self-pay | Admitting: Orthopedic Surgery

## 2020-01-22 ENCOUNTER — Encounter (HOSPITAL_BASED_OUTPATIENT_CLINIC_OR_DEPARTMENT_OTHER)
Admission: RE | Admit: 2020-01-22 | Discharge: 2020-01-22 | Disposition: A | Discharge status: Home / Self Care | Payer: BC Managed Care – PPO | Source: Ambulatory Visit | Attending: Orthopedic Surgery | Admitting: Orthopedic Surgery

## 2020-01-22 ENCOUNTER — Other Ambulatory Visit (HOSPITAL_COMMUNITY)
Admission: RE | Admit: 2020-01-22 | Discharge: 2020-01-22 | Disposition: A | Discharge status: Home / Self Care | Payer: BC Managed Care – PPO | Source: Ambulatory Visit | Attending: Orthopedic Surgery | Admitting: Orthopedic Surgery

## 2020-01-22 DIAGNOSIS — Z01810 Encounter for preprocedural cardiovascular examination: Secondary | ICD-10-CM | POA: Insufficient documentation

## 2020-01-22 DIAGNOSIS — Z01812 Encounter for preprocedural laboratory examination: Secondary | ICD-10-CM | POA: Diagnosis not present

## 2020-01-22 DIAGNOSIS — Z20822 Contact with and (suspected) exposure to covid-19: Secondary | ICD-10-CM | POA: Insufficient documentation

## 2020-01-22 LAB — SARS CORONAVIRUS 2 (TAT 6-24 HRS): SARS Coronavirus 2: NEGATIVE

## 2020-01-22 NOTE — Progress Notes (Signed)

## 2020-01-26 ENCOUNTER — Encounter (HOSPITAL_BASED_OUTPATIENT_CLINIC_OR_DEPARTMENT_OTHER): Payer: Self-pay | Admitting: Orthopedic Surgery

## 2020-01-26 ENCOUNTER — Encounter (HOSPITAL_BASED_OUTPATIENT_CLINIC_OR_DEPARTMENT_OTHER)
Admission: RE | Disposition: A | Discharge status: Home / Self Care | Payer: Self-pay | Source: Home / Self Care | Attending: Orthopedic Surgery

## 2020-01-26 ENCOUNTER — Other Ambulatory Visit: Payer: Self-pay

## 2020-01-26 ENCOUNTER — Ambulatory Visit (HOSPITAL_BASED_OUTPATIENT_CLINIC_OR_DEPARTMENT_OTHER)
Admission: RE | Admit: 2020-01-26 | Discharge: 2020-01-26 | Disposition: A | Discharge status: Home / Self Care | Payer: BC Managed Care – PPO | Attending: Orthopedic Surgery | Admitting: Orthopedic Surgery

## 2020-01-26 ENCOUNTER — Ambulatory Visit (HOSPITAL_BASED_OUTPATIENT_CLINIC_OR_DEPARTMENT_OTHER): Payer: BC Managed Care – PPO | Admitting: Anesthesiology

## 2020-01-26 DIAGNOSIS — I10 Essential (primary) hypertension: Secondary | ICD-10-CM | POA: Insufficient documentation

## 2020-01-26 DIAGNOSIS — Z8249 Family history of ischemic heart disease and other diseases of the circulatory system: Secondary | ICD-10-CM | POA: Diagnosis not present

## 2020-01-26 DIAGNOSIS — E7800 Pure hypercholesterolemia, unspecified: Secondary | ICD-10-CM | POA: Insufficient documentation

## 2020-01-26 DIAGNOSIS — G5602 Carpal tunnel syndrome, left upper limb: Secondary | ICD-10-CM | POA: Diagnosis not present

## 2020-01-26 DIAGNOSIS — Z881 Allergy status to other antibiotic agents status: Secondary | ICD-10-CM | POA: Diagnosis not present

## 2020-01-26 DIAGNOSIS — Z886 Allergy status to analgesic agent status: Secondary | ICD-10-CM | POA: Diagnosis not present

## 2020-01-26 DIAGNOSIS — F17210 Nicotine dependence, cigarettes, uncomplicated: Secondary | ICD-10-CM | POA: Insufficient documentation

## 2020-01-26 DIAGNOSIS — Z79899 Other long term (current) drug therapy: Secondary | ICD-10-CM | POA: Diagnosis not present

## 2020-01-26 HISTORY — PX: CARPAL TUNNEL RELEASE: SHX101

## 2020-01-26 SURGERY — CARPAL TUNNEL RELEASE
Anesthesia: Monitor Anesthesia Care | Site: Hand | Laterality: Left

## 2020-01-26 MED ORDER — ONDANSETRON HCL 4 MG/2ML IJ SOLN
INTRAMUSCULAR | Status: AC
  Filled 2020-01-26: qty 2

## 2020-01-26 MED ORDER — FENTANYL CITRATE (PF) 100 MCG/2ML IJ SOLN: 25.0000 ug | INTRAMUSCULAR | Status: DC | PRN

## 2020-01-26 MED ORDER — CEFAZOLIN SODIUM-DEXTROSE 2-4 GM/100ML-% IV SOLN
2.0000 g | INTRAVENOUS | Status: AC
  Administered 2020-01-26: 2 g via INTRAVENOUS

## 2020-01-26 MED ORDER — LACTATED RINGERS IV SOLN: INTRAVENOUS | Status: DC

## 2020-01-26 MED ORDER — ONDANSETRON HCL 4 MG/2ML IJ SOLN: 4.0000 mg | Freq: Once | INTRAMUSCULAR | Status: DC | PRN

## 2020-01-26 MED ORDER — 0.9 % SODIUM CHLORIDE (POUR BTL) OPTIME
TOPICAL | Status: DC | PRN
  Administered 2020-01-26: 200 mL

## 2020-01-26 MED ORDER — OXYCODONE HCL 5 MG PO TABS: 5.0000 mg | ORAL_TABLET | Freq: Once | ORAL | Status: DC | PRN

## 2020-01-26 MED ORDER — PROPOFOL 500 MG/50ML IV EMUL
INTRAVENOUS | Status: DC | PRN
  Administered 2020-01-26: 200 ug/kg/min via INTRAVENOUS

## 2020-01-26 MED ORDER — MIDAZOLAM HCL 5 MG/5ML IJ SOLN
INTRAMUSCULAR | Status: DC | PRN
  Administered 2020-01-26: 2 mg via INTRAVENOUS

## 2020-01-26 MED ORDER — HYDROCODONE-ACETAMINOPHEN 5-325 MG PO TABS: ORAL_TABLET | ORAL | 0 refills | Status: DC

## 2020-01-26 MED ORDER — BUPIVACAINE HCL (PF) 0.25 % IJ SOLN
INTRAMUSCULAR | Status: DC | PRN
  Administered 2020-01-26: 9 mL

## 2020-01-26 MED ORDER — FENTANYL CITRATE (PF) 100 MCG/2ML IJ SOLN
INTRAMUSCULAR | Status: DC | PRN
  Administered 2020-01-26: 100 ug via INTRAVENOUS

## 2020-01-26 MED ORDER — MIDAZOLAM HCL 2 MG/2ML IJ SOLN
INTRAMUSCULAR | Status: AC
  Filled 2020-01-26: qty 2

## 2020-01-26 MED ORDER — FENTANYL CITRATE (PF) 100 MCG/2ML IJ SOLN
INTRAMUSCULAR | Status: AC
  Filled 2020-01-26: qty 2

## 2020-01-26 MED ORDER — PROPOFOL 10 MG/ML IV BOLUS
INTRAVENOUS | Status: DC | PRN
  Administered 2020-01-26: 20 mg via INTRAVENOUS

## 2020-01-26 MED ORDER — CEFAZOLIN SODIUM-DEXTROSE 2-4 GM/100ML-% IV SOLN
INTRAVENOUS | Status: AC
  Filled 2020-01-26: qty 100

## 2020-01-26 MED ORDER — OXYCODONE HCL 5 MG/5ML PO SOLN: 5.0000 mg | Freq: Once | ORAL | Status: DC | PRN

## 2020-01-26 MED ORDER — LIDOCAINE HCL (PF) 0.5 % IJ SOLN
INTRAMUSCULAR | Status: DC | PRN
  Administered 2020-01-26: 30 mL via INTRAVENOUS

## 2020-01-26 SURGICAL SUPPLY — 37 items
APL PRP STRL LF DISP 70% ISPRP (MISCELLANEOUS) ×1
BLADE SURG 15 STRL LF DISP TIS (BLADE) ×2 IMPLANT
BLADE SURG 15 STRL SS (BLADE) ×4
BNDG CMPR 9X4 STRL LF SNTH (GAUZE/BANDAGES/DRESSINGS)
BNDG ELASTIC 3X5.8 VLCR STR LF (GAUZE/BANDAGES/DRESSINGS) ×2 IMPLANT
BNDG ESMARK 4X9 LF (GAUZE/BANDAGES/DRESSINGS) IMPLANT
BNDG GAUZE ELAST 4 BULKY (GAUZE/BANDAGES/DRESSINGS) ×2 IMPLANT
CHLORAPREP W/TINT 26 (MISCELLANEOUS) ×2 IMPLANT
CORD BIPOLAR FORCEPS 12FT (ELECTRODE) ×2 IMPLANT
COVER BACK TABLE 60X90IN (DRAPES) ×2 IMPLANT
COVER MAYO STAND STRL (DRAPES) ×2 IMPLANT
COVER WAND RF STERILE (DRAPES) IMPLANT
CUFF TOURN SGL QUICK 18X4 (TOURNIQUET CUFF) ×2 IMPLANT
DRAPE EXTREMITY T 121X128X90 (DISPOSABLE) ×2 IMPLANT
DRAPE SURG 17X23 STRL (DRAPES) ×2 IMPLANT
DRSG PAD ABDOMINAL 8X10 ST (GAUZE/BANDAGES/DRESSINGS) ×2 IMPLANT
GAUZE SPONGE 4X4 12PLY STRL (GAUZE/BANDAGES/DRESSINGS) ×2 IMPLANT
GAUZE XEROFORM 1X8 LF (GAUZE/BANDAGES/DRESSINGS) ×2 IMPLANT
GLOVE BIO SURGEON STRL SZ7 (GLOVE) ×1 IMPLANT
GLOVE BIO SURGEON STRL SZ7.5 (GLOVE) ×2 IMPLANT
GLOVE BIOGEL PI IND STRL 8 (GLOVE) ×1 IMPLANT
GLOVE BIOGEL PI INDICATOR 8 (GLOVE) ×1
GOWN STRL REUS W/ TWL LRG LVL3 (GOWN DISPOSABLE) ×1 IMPLANT
GOWN STRL REUS W/TWL LRG LVL3 (GOWN DISPOSABLE) ×2
GOWN STRL REUS W/TWL XL LVL3 (GOWN DISPOSABLE) ×2 IMPLANT
NDL HYPO 25X1 1.5 SAFETY (NEEDLE) ×1 IMPLANT
NEEDLE HYPO 25X1 1.5 SAFETY (NEEDLE) ×2 IMPLANT
NS IRRIG 1000ML POUR BTL (IV SOLUTION) ×2 IMPLANT
PACK BASIN DAY SURGERY FS (CUSTOM PROCEDURE TRAY) ×2 IMPLANT
PADDING CAST ABS 4INX4YD NS (CAST SUPPLIES) ×1
PADDING CAST ABS COTTON 4X4 ST (CAST SUPPLIES) ×1 IMPLANT
STOCKINETTE 4X48 STRL (DRAPES) ×2 IMPLANT
SUT ETHILON 4 0 PS 2 18 (SUTURE) ×2 IMPLANT
SYR BULB EAR ULCER 3OZ GRN STR (SYRINGE) ×2 IMPLANT
SYR CONTROL 10ML LL (SYRINGE) ×2 IMPLANT
TOWEL GREEN STERILE FF (TOWEL DISPOSABLE) ×4 IMPLANT
UNDERPAD 30X36 HEAVY ABSORB (UNDERPADS AND DIAPERS) ×2 IMPLANT

## 2020-01-26 NOTE — Op Note (Signed)
01/26/2020 [facility]                              OPERATIVE REPORT   PREOPERATIVE DIAGNOSIS:  Left carpal tunnel syndrome.  POSTOPERATIVE DIAGNOSIS:  Left carpal tunnel syndrome.  PROCEDURE:  Left carpal tunnel release.  SURGEON:  Leanora Cover, MD  ASSISTANT:  none.  ANESTHESIA: Bier block with sedation  IV FLUIDS:  Per anesthesia flow sheet.  ESTIMATED BLOOD LOSS:  Minimal.  COMPLICATIONS:  None.  SPECIMENS:  None.  TOURNIQUET TIME:    Total Tourniquet Time Documented: Forearm (Left) - 22 minutes Total: Forearm (Left) - 22 minutes   DISPOSITION:  Stable to PACU.  LOCATION: [facility]  INDICATIONS:  49 yo female with numbness and tingling left hand.  Nocturnal symptoms.  Positive nerve conduction studies.  She wishes to have a carpal tunnel release for management of her symptoms.  Risks, benefits and alternatives of surgery were discussed including the risk of blood loss; infection; damage to nerves, vessels, tendons, ligaments, bone; failure of surgery; need for additional surgery; complications with wound healing; continued pain; recurrence of carpal tunnel syndrome; and damage to motor branch. She voiced understanding of these risks and elected to proceed.   OPERATIVE COURSE:  After being identified preoperatively by myself, the patient and I agreed upon the procedure and site of procedure.  The surgical site was marked.  The risks, benefits, and alternatives of the surgery were reviewed and she wished to proceed.  Surgical consent had been signed.  She was given IV Ancef as preoperative antibiotic prophylaxis.  She was transferred to the operating room and placed on the operating room table in supine position with the Left upper extremity on an armboard.  Bier block anesthesia was induced by the anesthesiologist.  Left upper extremity was prepped and draped in normal sterile orthopaedic fashion.  A surgical pause was performed between the  surgeons, anesthesia, and operating room staff, and all were in agreement as to the patient, procedure, and site of procedure.  Tourniquet at the proximal aspect of the forearm had been inflated for the Bier block  Incision was made over the transverse carpal ligament and carried into the subcutaneous tissues by spreading technique.  Bipolar electrocautery was used to obtain hemostasis.  The palmar fascia was sharply incised.  The transverse carpal ligament was identified and sharply incised.  It was incised distally first.  The flexor tendons were identified.  The flexor tendon to the ring finger was identified and retracted radially.  The transverse carpal ligament was then incised proximally.  Scissors were used to split the distal aspect of the volar antebrachial fascia.  A finger was placed into the wound to ensure complete decompression, which was the case.  The nerve was examined.  It was flattened and hyperemic.  The motor branch was identified and was intact.  The wound was copiously irrigated with sterile saline.  It was then closed with 4-0 nylon in a horizontal mattress fashion.  It was injected with 0.25% plain Marcaine to aid in postoperative analgesia.  It was dressed with sterile Xeroform, 4x4s, an ABD, and wrapped with Kerlix and an Ace bandage.  Tourniquet was deflated at 22 minutes.  Fingertips were pink with brisk capillary refill after deflation of the tourniquet.  Operative drapes were broken down.  The patient was awoken from anesthesia safely.  She was transferred back to stretcher and taken to  the PACU in stable condition.  I will see her back in the office in 1 week for postoperative followup.  I will give her a prescription for Norco 5/325 1-2 tabs PO q6 hours prn pain, dispense # 20.    Leanora Cover, MD Electronically signed, 01/26/20

## 2020-01-26 NOTE — Transfer of Care (Signed)
Immediate Anesthesia Transfer of Care Note  Patient: Melanie Powell  Procedure(s) Performed: LEFT CARPAL TUNNEL RELEASE (Left Hand)  Patient Location: PACU  Anesthesia Type:MAC and Bier block  Level of Consciousness: awake, alert  and oriented  Airway & Oxygen Therapy: Patient Spontanous Breathing and Patient connected to face mask oxygen  Post-op Assessment: Report given to RN and Post -op Vital signs reviewed and stable  Post vital signs: Reviewed and stable  Last Vitals:  Vitals Value Taken Time  BP 110/62 01/26/20 1602  Temp 36.7 C 01/26/20 1602  Pulse 65 01/26/20 1606  Resp 20 01/26/20 1606  SpO2 96 % 01/26/20 1606  Vitals shown include unvalidated device data.  Last Pain:  Vitals:   01/26/20 1347  TempSrc: Oral  PainSc: 0-No pain      Patients Stated Pain Goal: 5 (58/59/29 2446)  Complications: No complications documented.

## 2020-01-26 NOTE — H&P (Signed)
Melanie Powell is an 49 y.o. female.   Chief Complaint: left carpal tunnel syndrome HPI: 49 yo female with numbness and tingling left hand.  Nocturnal symptoms.  Positive nerve conduction studies.    Allergies:  Allergies  Allergen Reactions  . Aspirin     GI problems  . Azithromycin   . Nsaids     Pt states she is unable to take due to GERD.    Past Medical History:  Diagnosis Date  . Anxiety   . Hypercholesteremia   . Hypertension   . Migraine   . SVD (spontaneous vaginal delivery)    x 4    Past Surgical History:  Procedure Laterality Date  . CARPAL TUNNEL RELEASE Right 03/15/2017   Procedure: Right CARPAL TUNNEL RELEASE;  Surgeon: Leanora Cover, MD;  Location: [facility];  Service: Orthopedics;  Laterality: Right;  Bier Block  . LAPAROSCOPIC ASSISTED VAGINAL HYSTERECTOMY N/A 09/11/2013   Procedure: LAPAROSCOPIC ASSISTED VAGINAL HYSTERECTOMY with bilateral salpingectomy and right labial biopsy with posterior fourchette;  Surgeon: Linda Hedges, DO;  Location: [facility];  Service: Gynecology;  Laterality: N/A;  . right side lung surgery     portaion of right lower lobe removed per patient - pt was in her 20's - no problems ever after surgery    Family History: Family History  Problem Relation Age of Onset  . Brain cancer Mother   . Heart failure Father   . Parkinson's disease Father   . Parkinson's disease Brother   . Parkinson's disease Paternal Grandmother   . Breast cancer Sister   . Ovarian cancer Maternal Aunt     Social History:   reports that she has been smoking cigarettes. She has a 22.00 pack-year smoking history. She has never used smokeless tobacco. She reports previous alcohol use. She reports that she does not use drugs.  Medications: Medications Prior to Admission  Medication Sig Dispense Refill  . gabapentin (NEURONTIN) 600 MG tablet Take 600 mg by mouth 3 (three) times daily.    . metoprolol succinate (TOPROL-XL) 50 MG 24 hr tablet Take  50 mg by mouth daily.    . pantoprazole (PROTONIX) 40 MG tablet Take 1 tablet (40 mg total) by mouth daily. Please schedule a yearly follow up with Dr. Ardis Hughs or Ellouise Newer for further refills. Thank you 90 tablet 4  . pregabalin (LYRICA) 75 MG capsule Take 75 mg by mouth 2 (two) times daily.    . rosuvastatin (CRESTOR) 20 MG tablet Take 20 mg by mouth daily.    . traMADol (ULTRAM) 50 MG tablet Take 50 mg by mouth every 6 (six) hours as needed.    . zolpidem (AMBIEN) 5 MG tablet Take 5 mg by mouth daily.      No results found for this or any previous visit (from the past 48 hour(s)).  No results found.   A comprehensive review of systems was negative.  Blood pressure 127/81, pulse 73, temperature 98.4 F (36.9 C), temperature source Oral, resp. rate 16, height 5\' 3"  (1.6 m), weight 61.4 kg, last menstrual period 08/19/2013, SpO2 100 %.  General appearance: alert, cooperative and appears stated age Head: Normocephalic, without obvious abnormality, atraumatic Neck: supple, symmetrical, trachea midline Cardio: regular rate and rhythm Resp: clear to auscultation bilaterally Extremities: Intact sensation and capillary refill all digits.  +epl/fpl/io.  No wounds.  Pulses: 2+ and symmetric Skin: Skin color, texture, turgor normal. No rashes or lesions Neurologic: Grossly normal Incision/Wound: none  Assessment/Plan Left  carpal tunnel syndrome.  Non operative and operative treatment options have been discussed with the patient and patient wishes to proceed with operative treatment. Risks, benefits, and alternatives of surgery have been discussed and the patient agrees with the plan of care.   Leanora Cover 01/26/2020, 1:56 PM

## 2020-01-26 NOTE — Anesthesia Preprocedure Evaluation (Addendum)
Anesthesia Evaluation  Patient identified by MRN, date of birth, ID band Patient awake    Reviewed: Allergy & Precautions, NPO status , Patient's Chart, lab work & pertinent test results  Airway Mallampati: II  TM Distance: >3 FB Neck ROM: Full    Dental  (+) Teeth Intact, Dental Advisory Given   Pulmonary Current Smoker and Patient abstained from smoking.,    breath sounds clear to auscultation       Cardiovascular hypertension,  Rhythm:Regular Rate:Normal     Neuro/Psych    GI/Hepatic   Endo/Other    Renal/GU      Musculoskeletal   Abdominal   Peds  Hematology   Anesthesia Other Findings   Reproductive/Obstetrics                             Anesthesia Physical Anesthesia Plan  ASA: II  Anesthesia Plan: MAC and Bier Block and Bier Block-LIDOCAINE ONLY   Post-op Pain Management:    Induction: Intravenous  PONV Risk Score and Plan: Ondansetron and Propofol infusion  Airway Management Planned: Natural Airway and Simple Face Mask  Additional Equipment:   Intra-op Plan:   Post-operative Plan:   Informed Consent: I have reviewed the patients History and Physical, chart, labs and discussed the procedure including the risks, benefits and alternatives for the proposed anesthesia with the patient or authorized representative who has indicated his/her understanding and acceptance.     Dental advisory given  Plan Discussed with: CRNA and Anesthesiologist  Anesthesia Plan Comments:         Anesthesia Quick Evaluation

## 2020-01-26 NOTE — Discharge Instructions (Addendum)

## 2020-01-26 NOTE — Anesthesia Procedure Notes (Signed)
Anesthesia Regional Block: Bier block (IV Regional)   Pre-Anesthetic Checklist: ,, timeout performed, Correct Patient, Correct Site, Correct Laterality, Correct Procedure,, site marked, surgical consent,, at surgeon's request  Laterality: Left     Needles:  Injection technique: Single-shot  Needle Type: Other      Needle Gauge: 22     Additional Needles:   Procedures:,,,,, intact distal pulses, Esmarch exsanguination, single tourniquet utilized,  Narrative:  Start time: 01/26/2020 3:34 PM End time: 01/26/2020 3:34 PM  Performed by: Personally

## 2020-01-27 ENCOUNTER — Encounter (HOSPITAL_BASED_OUTPATIENT_CLINIC_OR_DEPARTMENT_OTHER): Payer: Self-pay | Admitting: Orthopedic Surgery

## 2020-01-27 NOTE — Anesthesia Postprocedure Evaluation (Signed)
Anesthesia Post Note  Patient: Melanie Powell  Procedure(s) Performed: LEFT CARPAL TUNNEL RELEASE (Left Hand)     Patient location during evaluation: PACU Anesthesia Type: MAC Level of consciousness: awake and alert Pain management: pain level controlled Vital Signs Assessment: post-procedure vital signs reviewed and stable Respiratory status: spontaneous breathing, nonlabored ventilation, respiratory function stable and patient connected to nasal cannula oxygen Cardiovascular status: stable and blood pressure returned to baseline Postop Assessment: no apparent nausea or vomiting Anesthetic complications: no   No complications documented.  Last Vitals:  Vitals:   01/26/20 1620 01/26/20 1645  BP:  132/87  Pulse: (!) 55 60  Resp: 14 14  Temp:  36.7 C  SpO2: 94% 97%    Last Pain:  Vitals:   01/27/20 1109  TempSrc:   PainSc: 4                  Ruthann Angulo COKER

## 2020-02-02 ENCOUNTER — Other Ambulatory Visit: Payer: Self-pay | Admitting: Gastroenterology

## 2020-02-17 ENCOUNTER — Other Ambulatory Visit: Payer: Self-pay | Admitting: Gastroenterology

## 2020-04-08 ENCOUNTER — Other Ambulatory Visit: Payer: Self-pay | Admitting: Gastroenterology

## 2020-07-14 ENCOUNTER — Other Ambulatory Visit: Payer: Self-pay | Admitting: Gastroenterology

## 2020-08-01 ENCOUNTER — Other Ambulatory Visit: Payer: Self-pay | Admitting: Gastroenterology

## 2020-09-06 ENCOUNTER — Other Ambulatory Visit: Payer: Self-pay | Admitting: Gastroenterology

## 2020-09-28 ENCOUNTER — Other Ambulatory Visit: Payer: Self-pay | Admitting: Gastroenterology

## 2020-10-08 ENCOUNTER — Ambulatory Visit: Payer: BC Managed Care – PPO | Admitting: Podiatry

## 2020-10-08 ENCOUNTER — Other Ambulatory Visit: Payer: Self-pay

## 2020-10-08 ENCOUNTER — Ambulatory Visit: Payer: BC Managed Care – PPO

## 2020-10-08 DIAGNOSIS — M778 Other enthesopathies, not elsewhere classified: Secondary | ICD-10-CM | POA: Diagnosis not present

## 2020-10-08 DIAGNOSIS — M722 Plantar fascial fibromatosis: Secondary | ICD-10-CM

## 2020-10-13 ENCOUNTER — Encounter: Payer: Self-pay | Admitting: Podiatry

## 2020-10-13 NOTE — Progress Notes (Signed)
Subjective:  Patient ID: Melanie Powell, female    DOB: 12-01-1970,  MRN: [id number]  No chief complaint on file.   50 y.o. female presents with the above complaint.  Patient presents with complaint of left dorsal foot pain.  Patient states been hurting for quite some time has progressive gotten worse.  She was seen in the past for planter fasciitis however this is a new complaint with new pain.  She states painful when ambulating.  She has not tried any treatment options.  She is known to Dr. Paulla Dolly.  She would like to discuss treatment options for this.  She does have a boot at home.   Review of Systems: Negative except as noted in the HPI. Denies N/V/F/Ch.  Past Medical History:  Diagnosis Date  . Anxiety   . Hypercholesteremia   . Hypertension   . Migraine   . SVD (spontaneous vaginal delivery)    x 4    Current Outpatient Medications:  .  famotidine (PEPCID) 20 MG tablet, TAKE 1 TABLET BY MOUTH TWICE A DAY NEEDS APPT FOR REFILLS, Disp: 180 tablet, Rfl: 0 .  gabapentin (NEURONTIN) 600 MG tablet, Take 600 mg by mouth 3 (three) times daily., Disp: , Rfl:  .  HYDROcodone-acetaminophen (NORCO) 5-325 MG tablet, 1-2 tabs po q6 hours prn pain, Disp: 20 tablet, Rfl: 0 .  metoprolol succinate (TOPROL-XL) 50 MG 24 hr tablet, Take 50 mg by mouth daily., Disp: , Rfl:  .  pantoprazole (PROTONIX) 40 MG tablet, TAKE 1 TABLET BY MOUTH EVERY DAY, Disp: 90 tablet, Rfl: 0 .  pregabalin (LYRICA) 75 MG capsule, Take 75 mg by mouth 2 (two) times daily., Disp: , Rfl:  .  rosuvastatin (CRESTOR) 20 MG tablet, Take 20 mg by mouth daily., Disp: , Rfl:  .  zolpidem (AMBIEN) 5 MG tablet, Take 5 mg by mouth daily., Disp: , Rfl:   Social History   Tobacco Use  Smoking Status Current Every Day Smoker  . Packs/day: 1.00  . Years: 22.00  . Pack years: 22.00  . Types: Cigarettes  Smokeless Tobacco Never Used    Allergies  Allergen Reactions  . Aspirin     GI problems  . Azithromycin   . Nsaids     Pt  states she is unable to take due to GERD.   Objective:  There were no vitals filed for this visit. There is no height or weight on file to calculate BMI. Constitutional Well developed. Well nourished.  Vascular Dorsalis pedis pulses palpable bilaterally. Posterior tibial pulses palpable bilaterally. Capillary refill normal to all digits.  No cyanosis or clubbing noted. Pedal hair growth normal.  Neurologic Normal speech. Oriented to person, place, and time. Epicritic sensation to light touch grossly present bilaterally.  Dermatologic Nails well groomed and normal in appearance. No open wounds. No skin lesions.  Orthopedic:  Pain on palpation to the dorsal midfoot pain with resisted dorsiflexion of the digits no pain with plantarflexion of the digits.  No deep intra-articular pain noted at the Lisfranc joint as well as any of the metatarsophalangeal joint.   Radiographs: None Assessment:   1. Extensor tendinitis of foot    Plan:  Patient was evaluated and treated and all questions answered.  Left extensor tendinitis -I explained to the patient the etiology of extensor tendinitis and various treatment options were discussed.  Given the amount of pain she is having I believe she will benefit from cam boot immobilization.  She already has a cam boot  at home of asked her to place her self directly in the cam boot.  If there is relief in 4 weeks I will transition her to a Tri-Lock ankle brace and regular sneakers.  She states understanding.   No follow-ups on file.

## 2020-10-19 ENCOUNTER — Other Ambulatory Visit: Payer: Self-pay | Admitting: Gastroenterology

## 2020-11-12 ENCOUNTER — Ambulatory Visit: Payer: BC Managed Care – PPO | Admitting: Podiatry

## 2020-11-29 ENCOUNTER — Other Ambulatory Visit: Payer: Self-pay | Admitting: Gastroenterology

## 2020-12-13 ENCOUNTER — Other Ambulatory Visit: Payer: Self-pay | Admitting: Gastroenterology

## 2021-01-27 ENCOUNTER — Other Ambulatory Visit: Payer: Self-pay | Admitting: Gastroenterology

## 2021-02-13 ENCOUNTER — Other Ambulatory Visit: Payer: Self-pay | Admitting: Gastroenterology

## 2021-02-25 ENCOUNTER — Other Ambulatory Visit: Payer: Self-pay | Admitting: Gastroenterology

## 2021-03-13 ENCOUNTER — Other Ambulatory Visit: Payer: Self-pay | Admitting: Gastroenterology

## 2021-04-19 ENCOUNTER — Other Ambulatory Visit: Payer: Self-pay | Admitting: Gastroenterology

## 2021-05-07 ENCOUNTER — Other Ambulatory Visit: Payer: Self-pay | Admitting: Gastroenterology

## 2021-06-01 ENCOUNTER — Other Ambulatory Visit: Payer: Self-pay | Admitting: Gastroenterology

## 2021-06-28 ENCOUNTER — Other Ambulatory Visit: Payer: Self-pay | Admitting: Gastroenterology

## 2021-07-06 ENCOUNTER — Telehealth: Payer: BC Managed Care – PPO | Admitting: Gastroenterology

## 2021-07-12 ENCOUNTER — Telehealth: Payer: Self-pay | Admitting: Gastroenterology

## 2021-07-12 NOTE — Telephone Encounter (Signed)
Patient called and stated that she needs a refill for Protonix. Patient is being seen on 2/3 and stated that she needs it before then because she is very sick. Please advise.

## 2021-07-12 NOTE — Telephone Encounter (Signed)
Please advise. She cx her virtual visit on 06-26-21 and her office visit  scheduled for 08-16-2021 in person. Last seen [phone], but is scheduled now for in person visit on 07-27-2021.

## 2021-07-13 ENCOUNTER — Other Ambulatory Visit: Payer: Self-pay

## 2021-07-13 MED ORDER — PANTOPRAZOLE SODIUM 40 MG PO TBEC: 40.0000 mg | DELAYED_RELEASE_TABLET | Freq: Every day | ORAL | 0 refills | Status: DC

## 2021-07-13 NOTE — Telephone Encounter (Signed)
30 days supply only. Needs to keep scheduled follow up.

## 2021-07-17 ENCOUNTER — Other Ambulatory Visit: Payer: Self-pay | Admitting: Gastroenterology

## 2021-07-22 ENCOUNTER — Other Ambulatory Visit (INDEPENDENT_AMBULATORY_CARE_PROVIDER_SITE_OTHER): Payer: BC Managed Care – PPO

## 2021-07-22 ENCOUNTER — Ambulatory Visit: Payer: BC Managed Care – PPO | Admitting: Gastroenterology

## 2021-07-22 ENCOUNTER — Encounter: Payer: Self-pay | Admitting: Gastroenterology

## 2021-07-22 VITALS — BP 122/82 | HR 75 | Ht 64.0 in | Wt 133.0 lb

## 2021-07-22 DIAGNOSIS — R1084 Generalized abdominal pain: Secondary | ICD-10-CM

## 2021-07-22 DIAGNOSIS — K219 Gastro-esophageal reflux disease without esophagitis: Secondary | ICD-10-CM | POA: Diagnosis not present

## 2021-07-22 DIAGNOSIS — R197 Diarrhea, unspecified: Secondary | ICD-10-CM

## 2021-07-22 DIAGNOSIS — R112 Nausea with vomiting, unspecified: Secondary | ICD-10-CM | POA: Diagnosis not present

## 2021-07-22 LAB — CBC WITH DIFFERENTIAL/PLATELET
Basophils Absolute: 0.1 10*3/uL (ref 0.0–0.1)
Basophils Relative: 0.8 % (ref 0.0–3.0)
Eosinophils Absolute: 0.4 10*3/uL (ref 0.0–0.7)
Eosinophils Relative: 5.2 % — ABNORMAL HIGH (ref 0.0–5.0)
HCT: 40.2 % (ref 36.0–46.0)
Hemoglobin: 13.5 g/dL (ref 12.0–15.0)
Lymphocytes Relative: 49.3 % — ABNORMAL HIGH (ref 12.0–46.0)
Lymphs Abs: 3.9 10*3/uL (ref 0.7–4.0)
MCHC: 33.5 g/dL (ref 30.0–36.0)
MCV: 91.2 fl (ref 78.0–100.0)
Monocytes Absolute: 0.8 10*3/uL (ref 0.1–1.0)
Monocytes Relative: 10.5 % (ref 3.0–12.0)
Neutro Abs: 2.7 10*3/uL (ref 1.4–7.7)
Neutrophils Relative %: 34.2 % — ABNORMAL LOW (ref 43.0–77.0)
Platelets: 215 10*3/uL (ref 150.0–400.0)
RBC: 4.41 Mil/uL (ref 3.87–5.11)
RDW: 12.3 % (ref 11.5–15.5)
WBC: 8 10*3/uL (ref 4.0–10.5)

## 2021-07-22 LAB — COMPREHENSIVE METABOLIC PANEL
ALT: 15 U/L (ref 0–35)
AST: 17 U/L (ref 0–37)
Albumin: 4.6 g/dL (ref 3.5–5.2)
Alkaline Phosphatase: 78 U/L (ref 39–117)
BUN: 16 mg/dL (ref 6–23)
CO2: 31 mEq/L (ref 19–32)
Calcium: 9.6 mg/dL (ref 8.4–10.5)
Chloride: 103 mEq/L (ref 96–112)
Creatinine, Ser: 0.94 mg/dL (ref 0.40–1.20)
GFR: 71.83 mL/min (ref >60.00)
Glucose, Bld: 85 mg/dL (ref 70–99)
Potassium: 3.7 mEq/L (ref 3.5–5.1)
Sodium: 139 mEq/L (ref 135–145)
Total Bilirubin: 0.4 mg/dL (ref 0.2–1.2)
Total Protein: 7.1 g/dL (ref 6.0–8.3)

## 2021-07-22 LAB — TSH: TSH: 18.79 u[IU]/mL — ABNORMAL HIGH (ref 0.35–5.50)

## 2021-07-22 MED ORDER — PANTOPRAZOLE SODIUM 40 MG PO TBEC: 40.0000 mg | DELAYED_RELEASE_TABLET | Freq: Every day | ORAL | 3 refills | Status: DC

## 2021-07-22 MED ORDER — DICYCLOMINE HCL 10 MG PO CAPS: 10.0000 mg | ORAL_CAPSULE | Freq: Three times a day (TID) | ORAL | 0 refills | Status: DC

## 2021-07-22 MED ORDER — FAMOTIDINE 20 MG PO TABS: 20.0000 mg | ORAL_TABLET | Freq: Two times a day (BID) | ORAL | 3 refills | Status: DC | PRN

## 2021-07-22 MED ORDER — ONDANSETRON 4 MG PO TBDP: 4.0000 mg | ORAL_TABLET | Freq: Four times a day (QID) | ORAL | 0 refills | Status: DC | PRN

## 2021-07-22 NOTE — Progress Notes (Signed)
07/22/2021 Melanie Powell [id number] 05-Dec-1970   HISTORY OF PRESENT ILLNESS: This is a 51 year old female who is a patient of Dr. Ardis Hughs.  She follows here for issues with GERD.  This appointment was actually made because she needed refills on her pantoprazole and her Pepcid.  She is on pantoprazole 40 mg daily and takes Pepcid 20 mg twice daily as needed.  Today though she tells me that for the past month or maybe slightly longer she has been having a lot of generalized abdominal pain that she describes as being at 7 out of 10 on the pain scale as well as diarrhea several times a day, and nausea with episodes of vomiting.  She denies fever.  She denies seeing any blood in her stool.  She says initially she thought it was because she had ran out of her pantoprazole, but even when she started that back again obviously the symptoms did not resolve.  She says that she has lost about 15 pounds in a month.  She said that she has never had symptoms like this in the past.  She says that typically she tends to struggle with constipation.   Past Medical History:  Diagnosis Date   Anxiety    GERD (gastroesophageal reflux disease)    Hypercholesteremia    Hypertension    Migraine    SVD (spontaneous vaginal delivery)    x 4   Past Surgical History:  Procedure Laterality Date   CARPAL TUNNEL RELEASE Right 03/15/2017   Procedure: Right CARPAL TUNNEL RELEASE;  Surgeon: Leanora Cover, MD;  Location: [facility];  Service: Orthopedics;  Laterality: Right;  Bier Block   CARPAL TUNNEL RELEASE Left 01/26/2020   Procedure: LEFT CARPAL TUNNEL RELEASE;  Surgeon: Leanora Cover, MD;  Location: [facility];  Service: Orthopedics;  Laterality: Left;  Bier block   FOOT SURGERY Bilateral    Nov and Dec 2021 for bone spurs   LAPAROSCOPIC ASSISTED VAGINAL HYSTERECTOMY N/A 09/11/2013   Procedure: LAPAROSCOPIC ASSISTED VAGINAL HYSTERECTOMY with bilateral salpingectomy and right labial  biopsy with posterior fourchette;  Surgeon: Linda Hedges, DO;  Location: [facility];  Service: Gynecology;  Laterality: N/A;   right side lung surgery     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 She has a 22.00 pack-year smoking history. She has never used smokeless tobacco. She reports that she does not currently use alcohol. She reports that she does not use drugs. family history includes Brain cancer in her mother; Breast cancer in her sister; Heart failure in her father; Ovarian cancer in her maternal aunt; Parkinson's disease in her brother, father, and paternal grandmother. Allergies  Allergen Reactions   Aspirin     GI problems   Azithromycin    Nsaids     Pt states she is unable to take due to GERD.      Outpatient Encounter Medications as of 07/22/2021  Medication Sig   Bismuth Subsalicylate (PEPTO-BISMOL PO) Take by mouth. As needed for diarrhea   famotidine (PEPCID) 20 MG tablet TAKE 1 TABLET BY MOUTH TWICE A DAY NEEDS APPT FOR REFILLS   gabapentin (NEURONTIN) 600 MG tablet Take 600 mg by mouth 2 (two) times daily.   loperamide (IMODIUM) 2 MG capsule Take 4 mg by mouth as needed for diarrhea or loose stools.   metoprolol succinate (TOPROL-XL)  50 MG 24 hr tablet Take 50 mg by mouth daily.   pantoprazole (PROTONIX) 40 MG tablet Take 1 tablet (40 mg total) by mouth daily.   rosuvastatin (CRESTOR) 20 MG tablet Take 20 mg by mouth daily.   [DISCONTINUED] HYDROcodone-acetaminophen (NORCO) 5-325 MG tablet 1-2 tabs po q6 hours prn pain   [DISCONTINUED] pregabalin (LYRICA) 75 MG capsule Take 75 mg by mouth 2 (two) times daily.   [DISCONTINUED] zolpidem (AMBIEN) 5 MG tablet Take 5 mg by mouth daily.   No facility-administered encounter medications on file as of 07/22/2021.     REVIEW OF SYSTEMS  : All other systems reviewed and negative except where noted in the History of Present  Illness.   PHYSICAL EXAM: BP 122/82    Pulse 75    Ht 5\' 4"  (1.626 m)    Wt 133 lb (60.3 kg)    LMP 08/19/2013    BMI 22.83 kg/m  General: Well developed white female in no acute distress Head: Normocephalic and atraumatic Eyes:  Sclerae anicteric, conjunctiva pink. Ears: Normal auditory acuity Lungs: Clear throughout to auscultation; no W/R/R. Heart: Regular rate and rhythm; no M/R/G. Abdomen: Soft, non-distended.  BS present.  Minimal diffuse TTP.  Abdominal exam benign. Musculoskeletal: Symmetrical with no gross deformities  Skin: No lesions on visible extremities Extremities: No edema  Neurological: Alert oriented x 4, grossly non-focal Psychological:  Alert and cooperative. Normal mood and affect  ASSESSMENT AND PLAN: *About 1 month of sudden onset generalized abdominal pain, diarrhea, and nausea.  She says that typically she has always struggled with constipation.  Due to the sudden onset I think that we need to rule out prolonged infectious source.  We will check labs including CBC, CMP, TSH.  We will check a GI pathogen panel.  We will also plan for CT scan of the abdomen and pelvis with contrast.  If those are unremarkable then may need to plan for colonoscopy.  We will send prescriptions for Zofran for nausea and Bentyl 10 mg before meals and at bedtime to use for abdominal pain in the interim. *GERD: Overall well controlled on pantoprazole 40 mg daily and Pepcid 20 mg twice daily as needed.  Prescriptions sent to pharmacy.   CC:  Long, [place], PA-C

## 2021-07-22 NOTE — Patient Instructions (Signed)
Your provider has requested that you go to the basement level for lab work before leaving today. Press "B" on the elevator. The lab is located at the first door on the left as you exit the elevator.  We have sent the following medications to your pharmacy for you to pick up at your convenience: Zofran 4 mg ODT every 6 hours as needed. Bentyl 10 mg four times daily before meals and at bedtime. Pantoprazole 40 mg daily 30-60 minutes before breakfast.  Pepcid 20 mg twice daily as needed.  You have been scheduled for a CT scan of the abdomen and pelvis at [facility] ([address]---this is in the same building as [employer]).   You are scheduled on Wednesday 08/03/21 at 3 pm. You should arrive 15 minutes prior to your appointment time for registration. Please follow the written instructions below on the day of your exam:  WARNING: IF YOU ARE ALLERGIC TO IODINE/X-RAY DYE, PLEASE NOTIFY RADIOLOGY IMMEDIATELY AT [id number]! YOU WILL BE GIVEN A 13 HOUR PREMEDICATION PREP.  1) Do not eat or drink anything after 11:00am (4 hours prior to your test) 2) You have been given 2 bottles of oral contrast to drink. The solution may taste better if refrigerated, but do NOT add ice or any other liquid to this solution. Shake well before drinking.    Drink 1 bottle of contrast @ 1:00pm (2 hours prior to your exam)  Drink 1 bottle of contrast @ 2:00pm (1 hour prior to your exam)  You may take any medications as prescribed with a small amount of water, if necessary. If you take any of the following medications: METFORMIN, GLUCOPHAGE, GLUCOVANCE, AVANDAMET, RIOMET, FORTAMET, [place] MET, JANUMET, GLUMETZA or METAGLIP, you MAY be asked to HOLD this medication 48 hours AFTER the exam.  The purpose of you drinking the oral contrast is to aid in the visualization of your intestinal tract. The contrast solution may cause some diarrhea. Depending on your individual set of symptoms, you may  also receive an intravenous injection of x-ray contrast/dye. Plan on being at [facility] for 30 minutes or longer, depending on the type of exam you are having performed.  This test typically takes 30-45 minutes to complete.  If you have any questions regarding your exam or if you need to reschedule, you may call the CT department at [phone] between the hours of 8:00 am and 5:00 pm, Monday-Friday.  ___________________________________________________________________

## 2021-07-24 NOTE — Progress Notes (Signed)
I agree with the above note, plan 

## 2021-07-25 ENCOUNTER — Other Ambulatory Visit: Payer: BC Managed Care – PPO

## 2021-07-25 DIAGNOSIS — K219 Gastro-esophageal reflux disease without esophagitis: Secondary | ICD-10-CM

## 2021-07-25 DIAGNOSIS — R1084 Generalized abdominal pain: Secondary | ICD-10-CM

## 2021-07-25 DIAGNOSIS — R197 Diarrhea, unspecified: Secondary | ICD-10-CM

## 2021-07-25 DIAGNOSIS — R112 Nausea with vomiting, unspecified: Secondary | ICD-10-CM

## 2021-07-27 LAB — GI PROFILE, STOOL, PCR

## 2021-07-28 ENCOUNTER — Telehealth: Payer: Self-pay | Admitting: Gastroenterology

## 2021-07-28 NOTE — Telephone Encounter (Signed)
Inbound call from patient requesting results from stool sample brought in 2/6. Please advise.

## 2021-07-28 NOTE — Telephone Encounter (Signed)
Spoke with pt regarding stool sample. She is aware that Melanie Powell will need to review before we can give the results. No further questions.

## 2021-07-29 ENCOUNTER — Telehealth: Payer: Self-pay | Admitting: Gastroenterology
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
## 2021-07-29 NOTE — Telephone Encounter (Signed)
Patient has been provided number to reschedule CT.

## 2021-08-03 ENCOUNTER — Inpatient Hospital Stay: Admission: RE | Admit: 2021-08-03 | Payer: BC Managed Care – PPO | Source: Ambulatory Visit

## 2021-08-13 ENCOUNTER — Other Ambulatory Visit: Payer: Self-pay | Admitting: Gastroenterology

## 2021-08-16 ENCOUNTER — Ambulatory Visit: Payer: BC Managed Care – PPO | Admitting: Gastroenterology

## 2021-08-18 ENCOUNTER — Telehealth: Payer: Self-pay

## 2021-08-18 NOTE — Telephone Encounter (Signed)
Patient called in with questions related to CT on Monday. All questions answered, no further questions. ?

## 2021-08-19 ENCOUNTER — Encounter: Payer: Self-pay | Admitting: *Deleted

## 2021-08-19 ENCOUNTER — Other Ambulatory Visit: Payer: Self-pay | Admitting: *Deleted

## 2021-08-19 DIAGNOSIS — R197 Diarrhea, unspecified: Secondary | ICD-10-CM

## 2021-08-22 ENCOUNTER — Telehealth: Payer: Self-pay

## 2021-08-22 ENCOUNTER — Other Ambulatory Visit (INDEPENDENT_AMBULATORY_CARE_PROVIDER_SITE_OTHER): Payer: BC Managed Care – PPO

## 2021-08-22 ENCOUNTER — Inpatient Hospital Stay: Admission: RE | Admit: 2021-08-22 | Payer: Self-pay | Source: Ambulatory Visit

## 2021-08-22 DIAGNOSIS — R197 Diarrhea, unspecified: Secondary | ICD-10-CM

## 2021-08-22 LAB — BASIC METABOLIC PANEL
BUN: 13 mg/dL (ref 6–23)
CO2: 27 mEq/L (ref 19–32)
Calcium: 9.4 mg/dL (ref 8.4–10.5)
Chloride: 103 mEq/L (ref 96–112)
Creatinine, Ser: 0.84 mg/dL (ref 0.40–1.20)
GFR: 82.17 mL/min (ref >60.00)
Glucose, Bld: 103 mg/dL — ABNORMAL HIGH (ref 70–99)
Potassium: 3.8 mEq/L (ref 3.5–5.1)
Sodium: 139 mEq/L (ref 135–145)

## 2021-08-29 ENCOUNTER — Ambulatory Visit (INDEPENDENT_AMBULATORY_CARE_PROVIDER_SITE_OTHER)
Admission: RE | Admit: 2021-08-29 | Discharge: 2021-08-29 | Disposition: A | Discharge status: Home / Self Care | Payer: BC Managed Care – PPO | Source: Ambulatory Visit | Attending: Gastroenterology | Admitting: Gastroenterology

## 2021-08-29 ENCOUNTER — Other Ambulatory Visit: Payer: Self-pay

## 2021-08-29 DIAGNOSIS — R1084 Generalized abdominal pain: Secondary | ICD-10-CM

## 2021-08-29 DIAGNOSIS — K219 Gastro-esophageal reflux disease without esophagitis: Secondary | ICD-10-CM

## 2021-08-29 DIAGNOSIS — R112 Nausea with vomiting, unspecified: Secondary | ICD-10-CM | POA: Diagnosis not present

## 2021-08-29 DIAGNOSIS — R197 Diarrhea, unspecified: Secondary | ICD-10-CM

## 2021-08-29 MED ORDER — IOHEXOL 300 MG/ML  SOLN
100.0000 mL | Freq: Once | INTRAMUSCULAR | Status: AC | PRN
  Administered 2021-08-29: 100 mL via INTRAVENOUS

## 2021-08-30 ENCOUNTER — Telehealth: Payer: Self-pay | Admitting: Gastroenterology

## 2021-08-30 NOTE — Telephone Encounter (Signed)
Patient had a CT scan done yesterday and is calling for the results.  Please call patient and advise.  Thank you. ?

## 2021-08-30 NOTE — Telephone Encounter (Signed)
Patient is aware that PA still needs to review CT scan results and once she has done so we will let her know the results. She asked that we call her husband with results if she does not answer phone call this week. ?

## 2021-09-02 NOTE — Telephone Encounter (Signed)
Patient called requesting results from CT, please advise.  ?

## 2021-09-05 NOTE — Telephone Encounter (Signed)
I spoke with the pt and gave her the CT results.  She was also asked if she has followed up with her PCP in regards to her thyroid.  She tells me she has not but she will call now to make an appt.  She says her symptoms are the same- no worse no better.  She does not wish to schedule colonoscopy at this time.  She will call back when/if she is ready.   ? ?Melanie Powell  ?

## 2021-09-05 NOTE — Telephone Encounter (Signed)
Tried again to reach the pt by her phone and her husbands phone with no answer.  Messages left.  I have also sent a letter to My Chart  ?

## 2021-09-05 NOTE — Telephone Encounter (Signed)
Please let her know that her CT scan did not show any cause of her symptoms.  She has a very tiny hiatal hernia and scattered diverticulosis throughout the colon, but no diverticulitis.  Has she seen her PCP in regards to her thyroid issues?  I think that could be causing a lot of her symptoms.  Otherwise, has she been feeling any better?  She does need colonoscopy scheduled with Dr. Ardis Hughs if she is ready and willing to schedule that as well. ?  ?Thank you, ?  ?Jess  ?

## 2021-09-06 NOTE — Telephone Encounter (Signed)
Placed another call to the pt and advised that colon is recommended per Janett Billow.  She states she prefers to call back when she decides to set up.   ?

## 2021-09-08 ENCOUNTER — Telehealth: Payer: Self-pay | Admitting: Gastroenterology

## 2021-09-08 NOTE — Telephone Encounter (Signed)
Patient called regarding the letter she received about test results and recommendations. Please advise ?

## 2021-09-09 NOTE — Telephone Encounter (Signed)
The pt has been given the information that was in the letter.  We had already spoke prior to her receiving the letter.   ?

## 2021-09-20 ENCOUNTER — Other Ambulatory Visit: Payer: Self-pay | Admitting: Gastroenterology

## 2021-10-04 ENCOUNTER — Other Ambulatory Visit: Payer: Self-pay | Admitting: Gastroenterology

## 2021-10-30 ENCOUNTER — Other Ambulatory Visit: Payer: Self-pay | Admitting: Gastroenterology

## 2021-12-22 ENCOUNTER — Other Ambulatory Visit: Payer: Self-pay | Admitting: Gastroenterology

## 2022-03-31 ENCOUNTER — Other Ambulatory Visit: Payer: Self-pay

## 2022-04-13 ENCOUNTER — Other Ambulatory Visit: Payer: Self-pay

## 2022-07-07 ENCOUNTER — Other Ambulatory Visit: Payer: Self-pay | Admitting: Family Medicine

## 2022-07-07 DIAGNOSIS — Z1231 Encounter for screening mammogram for malignant neoplasm of breast: Secondary | ICD-10-CM

## 2022-07-22 ENCOUNTER — Other Ambulatory Visit: Payer: Self-pay | Admitting: Gastroenterology

## 2022-08-08 ENCOUNTER — Other Ambulatory Visit (HOSPITAL_COMMUNITY): Payer: Self-pay

## 2022-08-08 MED ORDER — HYDROCODONE-ACETAMINOPHEN 7.5-325 MG PO TABS
0.5000 | ORAL_TABLET | Freq: Two times a day (BID) | ORAL | 0 refills | Status: DC
  Filled 2022-08-08 – 2022-08-09 (×2): qty 60, 30d supply, fill #0

## 2022-08-09 ENCOUNTER — Other Ambulatory Visit (HOSPITAL_COMMUNITY): Payer: Self-pay

## 2022-08-28 ENCOUNTER — Ambulatory Visit: Payer: BC Managed Care – PPO

## 2022-08-28 ENCOUNTER — Other Ambulatory Visit: Payer: Self-pay | Admitting: Gastroenterology

## 2022-09-04 ENCOUNTER — Other Ambulatory Visit: Payer: Self-pay | Admitting: Gastroenterology

## 2022-09-07 ENCOUNTER — Other Ambulatory Visit (HOSPITAL_COMMUNITY): Payer: Self-pay

## 2022-09-08 ENCOUNTER — Other Ambulatory Visit (HOSPITAL_COMMUNITY): Payer: Self-pay

## 2022-09-13 ENCOUNTER — Other Ambulatory Visit (HOSPITAL_COMMUNITY): Payer: Self-pay

## 2022-10-06 ENCOUNTER — Ambulatory Visit: Payer: BC Managed Care – PPO

## 2022-10-09 ENCOUNTER — Other Ambulatory Visit (HOSPITAL_COMMUNITY): Payer: Self-pay

## 2022-10-09 MED ORDER — HYDROCODONE-ACETAMINOPHEN 7.5-325 MG PO TABS
0.5000 | ORAL_TABLET | Freq: Two times a day (BID) | ORAL | 0 refills | Status: DC
  Filled 2022-10-09: qty 60, 30d supply, fill #0

## 2022-10-11 ENCOUNTER — Ambulatory Visit
Admission: RE | Admit: 2022-10-11 | Discharge: 2022-10-11 | Disposition: A | Discharge status: Home / Self Care | Payer: BC Managed Care – PPO | Source: Ambulatory Visit | Attending: Family Medicine | Admitting: Family Medicine

## 2022-10-11 DIAGNOSIS — Z1231 Encounter for screening mammogram for malignant neoplasm of breast: Secondary | ICD-10-CM

## 2022-10-13 ENCOUNTER — Other Ambulatory Visit: Payer: Self-pay | Admitting: Nurse Practitioner

## 2022-10-13 ENCOUNTER — Other Ambulatory Visit: Payer: Self-pay | Admitting: Family Medicine

## 2022-10-13 DIAGNOSIS — R928 Other abnormal and inconclusive findings on diagnostic imaging of breast: Secondary | ICD-10-CM

## 2022-10-25 ENCOUNTER — Ambulatory Visit
Admission: RE | Admit: 2022-10-25 | Discharge: 2022-10-25 | Disposition: A | Discharge status: Home / Self Care | Payer: BC Managed Care – PPO | Source: Ambulatory Visit | Attending: Family Medicine | Admitting: Family Medicine

## 2022-10-25 DIAGNOSIS — R928 Other abnormal and inconclusive findings on diagnostic imaging of breast: Secondary | ICD-10-CM

## 2022-11-08 ENCOUNTER — Other Ambulatory Visit (HOSPITAL_COMMUNITY): Payer: Self-pay

## 2022-11-08 MED ORDER — PANTOPRAZOLE SODIUM 40 MG PO TBEC
40.0000 mg | DELAYED_RELEASE_TABLET | Freq: Every day | ORAL | 2 refills | Status: AC
  Filled 2022-11-08 – 2023-02-07 (×2): qty 30, 30d supply, fill #0

## 2022-11-08 MED ORDER — HYDROCODONE-ACETAMINOPHEN 7.5-325 MG PO TABS
0.5000 | ORAL_TABLET | Freq: Two times a day (BID) | ORAL | 0 refills | Status: DC
  Filled 2022-11-08: qty 60, 30d supply, fill #0

## 2022-11-08 MED ORDER — FAMOTIDINE 20 MG PO TABS
20.0000 mg | ORAL_TABLET | Freq: Every evening | ORAL | 2 refills | Status: DC
  Filled 2022-11-08: qty 10, 10d supply, fill #0

## 2022-11-11 ENCOUNTER — Other Ambulatory Visit (HOSPITAL_COMMUNITY): Payer: Self-pay

## 2022-11-16 ENCOUNTER — Other Ambulatory Visit: Payer: Self-pay | Admitting: Gastroenterology

## 2022-11-20 ENCOUNTER — Other Ambulatory Visit: Payer: Self-pay | Admitting: Gastroenterology

## 2022-11-20 IMAGING — CT CT ABD-PELV W/ CM
2 of 5 series · 15 of 46 positions shown, 17 images · IV contrast (OMNIPAQUE 300)
Comparison: None.

CLINICAL DATA: Four weeks of diarrhea common nausea, vomiting.
Generalized abdominal pain.

EXAM:
CT ABDOMEN AND PELVIS WITH CONTRAST
TECHNIQUE: Multidetector CT imaging of the abdomen and pelvis was performed
using the standard protocol following bolus administration of
intravenous contrast.

[Series 2: abd/pel w · axial · 0.65mm/px · z∈[-500,-120]mm · 12 of 86 slices shown, 14 images]
[im 5/86  soft-tissue]
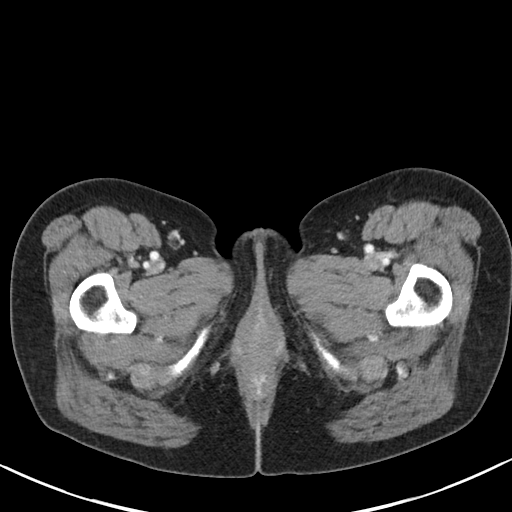
[im 5/86  bone]
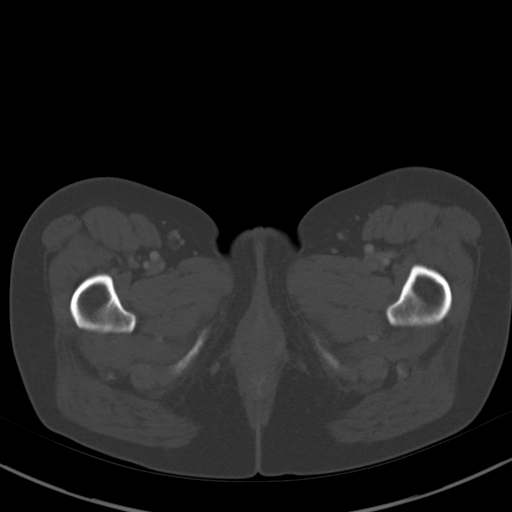
[im 15/86  soft-tissue]
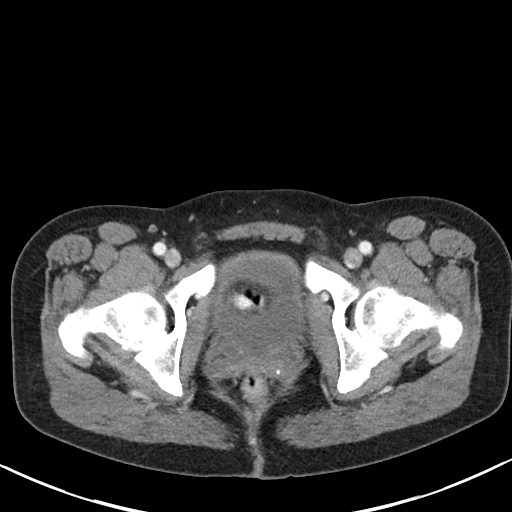
[im 19/86  soft-tissue]
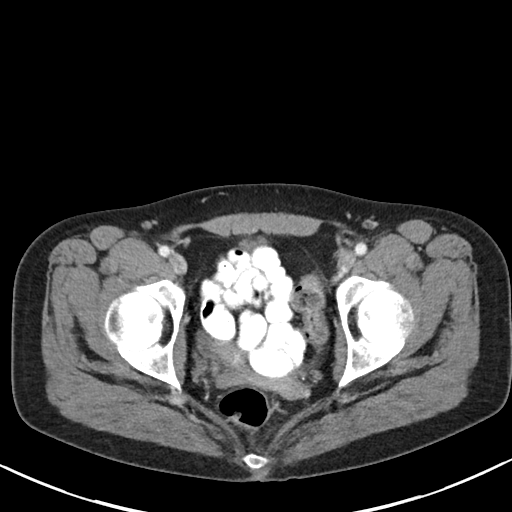
[im 24/86  soft-tissue]
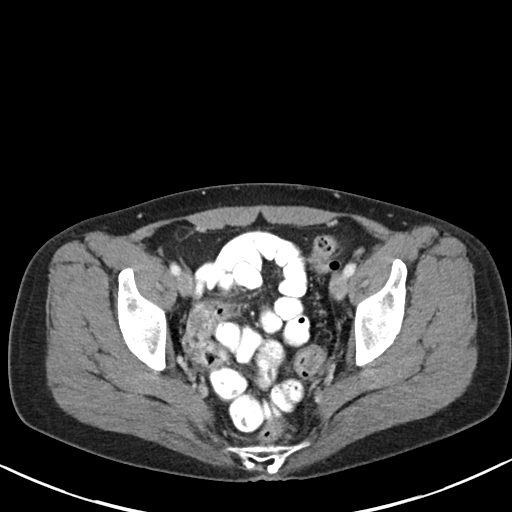
[im 34/86  soft-tissue]
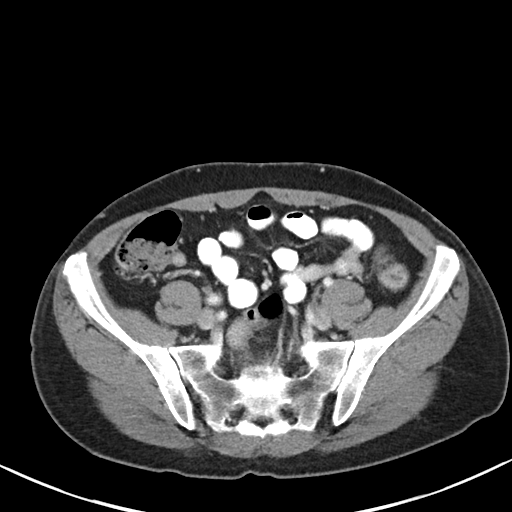
[im 38/86  soft-tissue]
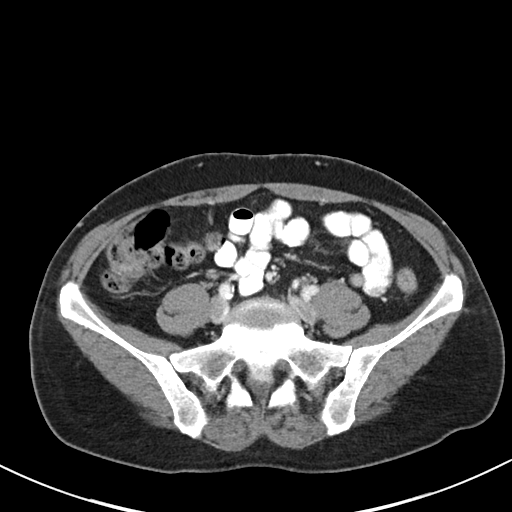
[im 48/86  soft-tissue]
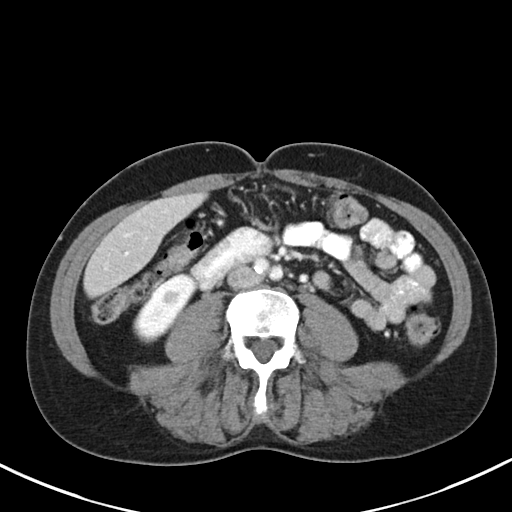
[im 52/86  soft-tissue]
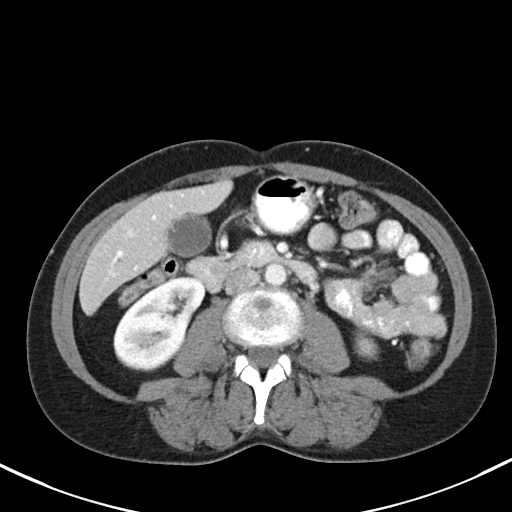
[im 62/86  soft-tissue]
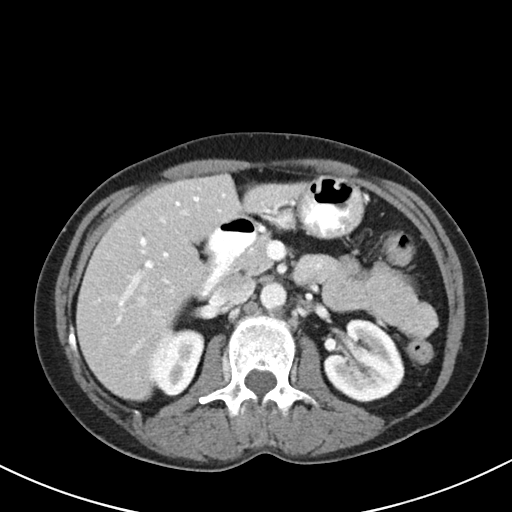
[im 62/86  bone]
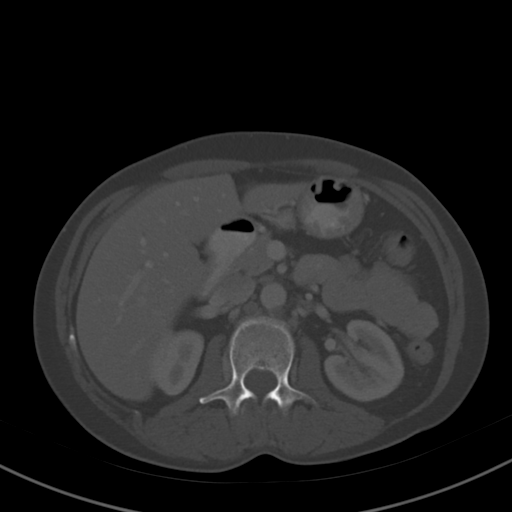
[im 67/86  soft-tissue]
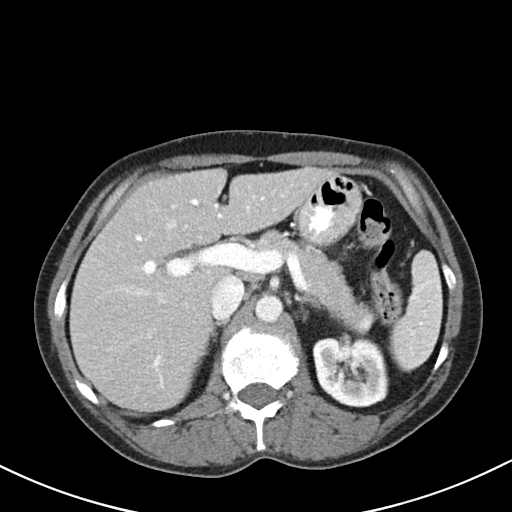
[im 71/86  soft-tissue]
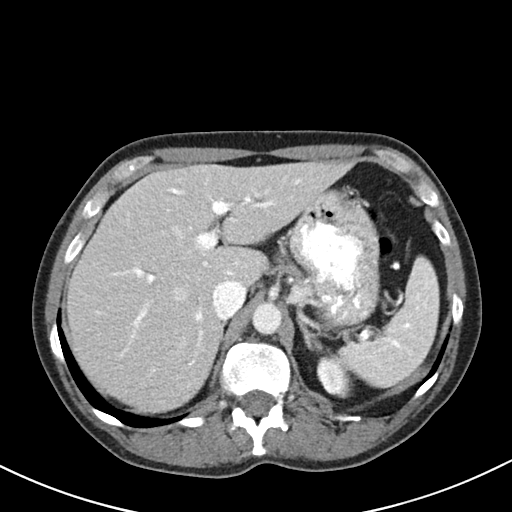
[im 81/86  soft-tissue]
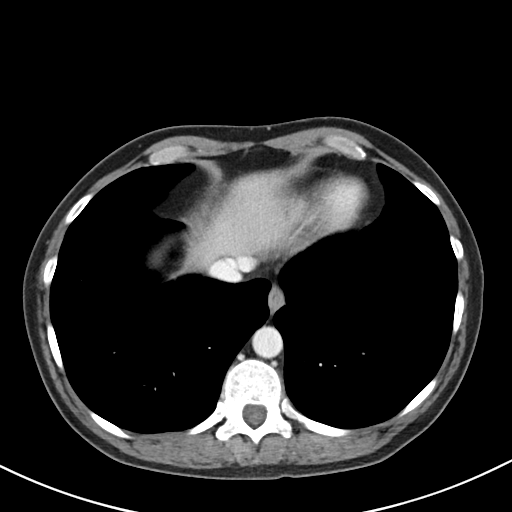

[Series 5: coronal st · coronal · 0.58mm/px · 3 of 70 slices shown]
[im 24/70  soft-tissue]
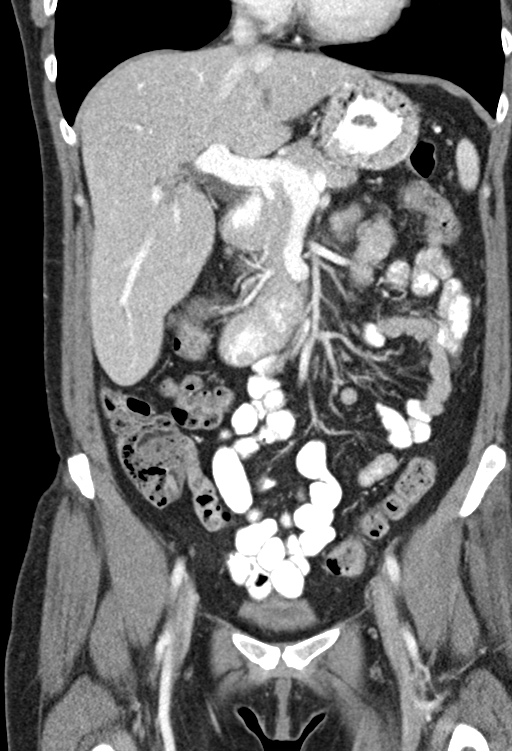
[im 31/70  soft-tissue]
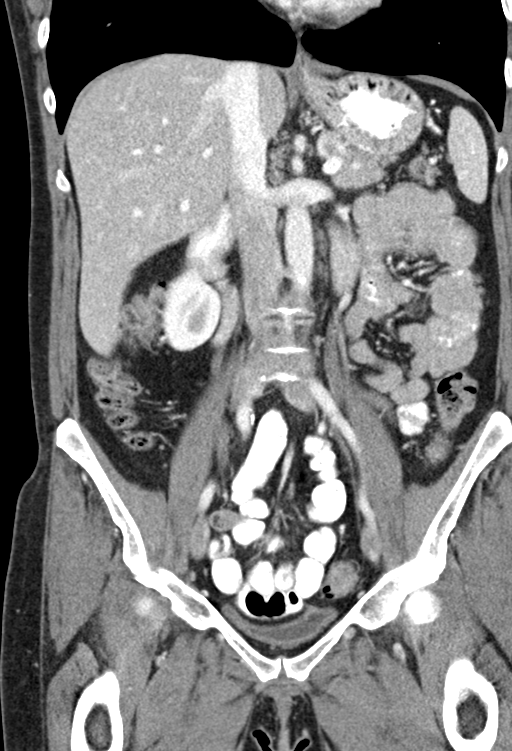
[im 39/70  soft-tissue]
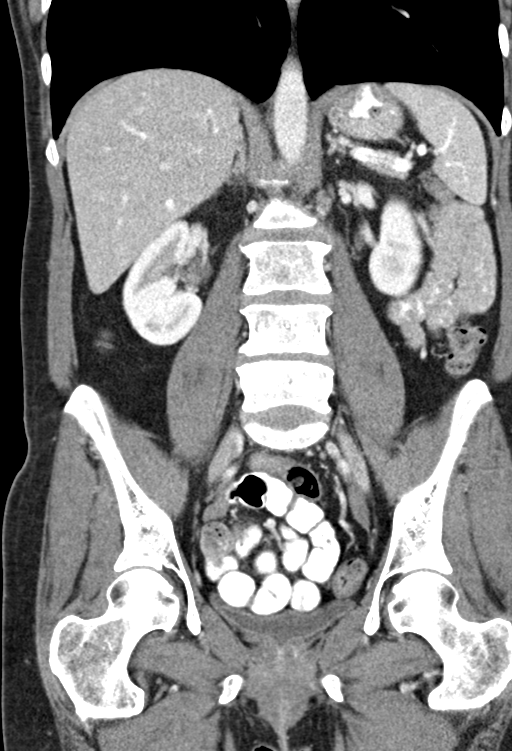

[15 of 46 positions shown; findings below may reference images not displayed]

RADIATION DOSE REDUCTION: This exam was performed according to the
departmental dose-optimization program which includes automated
exposure control, adjustment of the mA and/or kV according to
patient size and/or use of iterative reconstruction technique.

CONTRAST:  100mL OMNIPAQUE IOHEXOL 300 MG/ML  SOLN
FINDINGS: Lower chest: No acute abnormality.

Hepatobiliary: No suspicious hepatic lesion. Gallbladder is
unremarkable. No biliary ductal dilation.

Pancreas: No pancreatic ductal dilation or evidence of acute
inflammation.

Spleen: No splenomegaly or focal splenic lesion.

Adrenals/Urinary Tract: Bilateral adrenal glands appear normal. No
hydronephrosis. Kidneys demonstrate symmetric enhancement and
excretion of contrast material. No solid enhancing renal mass.
Urinary bladder is nondistended limiting evaluation.

Stomach/Bowel: Radiopaque enteric contrast material traverses distal
loops of small bowel. Tiny hiatal hernia otherwise the stomach is
unremarkable for degree of distension. No pathologic dilation of
small or large bowel. The appendix and terminal ileum appear normal.
Scattered colonic diverticulosis without findings of acute
diverticulitis. No evidence of acute bowel inflammation.

Vascular/Lymphatic: Scattered aortic atherosclerosis without
abdominal aortic aneurysm. No pathologically enlarged abdominal or
pelvic lymph nodes.

Reproductive: Status post hysterectomy. No adnexal masses.

Other: No significant abdominopelvic free fluid.

Musculoskeletal: Multilevel degenerative changes spine. No acute
osseous abnormality.
IMPRESSION: 1. No acute abdominopelvic findings.
2. Scattered colonic diverticulosis without findings of acute
diverticulitis.
3. Tiny hiatal hernia.
4.  Aortic Atherosclerosis ([id number]).

## 2022-11-25 ENCOUNTER — Other Ambulatory Visit: Payer: Self-pay | Admitting: Gastroenterology

## 2022-12-07 ENCOUNTER — Other Ambulatory Visit (HOSPITAL_COMMUNITY): Payer: Self-pay

## 2022-12-07 MED ORDER — PREDNISONE 20 MG PO TABS
ORAL_TABLET | ORAL | 0 refills | Status: AC
  Filled 2022-12-07: qty 7, 6d supply, fill #0

## 2022-12-07 MED ORDER — HYDROCODONE-ACETAMINOPHEN 7.5-325 MG PO TABS
0.5000 | ORAL_TABLET | Freq: Two times a day (BID) | ORAL | 0 refills | Status: DC
  Filled 2022-12-07: qty 60, 30d supply, fill #0
  Filled 2022-12-08: qty 55, 27d supply, fill #0
  Filled 2022-12-08: qty 5, 3d supply, fill #0

## 2022-12-08 ENCOUNTER — Other Ambulatory Visit (HOSPITAL_COMMUNITY): Payer: Self-pay

## 2022-12-12 ENCOUNTER — Other Ambulatory Visit: Payer: Self-pay | Admitting: Gastroenterology

## 2023-01-08 ENCOUNTER — Other Ambulatory Visit (HOSPITAL_COMMUNITY): Payer: Self-pay

## 2023-01-09 ENCOUNTER — Other Ambulatory Visit (HOSPITAL_COMMUNITY): Payer: Self-pay

## 2023-01-09 MED ORDER — HYDROCODONE-ACETAMINOPHEN 7.5-325 MG PO TABS
0.5000 | ORAL_TABLET | Freq: Two times a day (BID) | ORAL | 0 refills | Status: DC
  Filled 2023-01-09: qty 60, 30d supply, fill #0

## 2023-01-15 ENCOUNTER — Other Ambulatory Visit: Payer: Self-pay | Admitting: Gastroenterology

## 2023-01-30 ENCOUNTER — Other Ambulatory Visit: Payer: Self-pay | Admitting: Gastroenterology

## 2023-02-07 ENCOUNTER — Other Ambulatory Visit (HOSPITAL_COMMUNITY): Payer: Self-pay

## 2023-02-07 MED ORDER — HYDROCODONE-ACETAMINOPHEN 7.5-325 MG PO TABS
0.5000 | ORAL_TABLET | Freq: Two times a day (BID) | ORAL | 0 refills | Status: DC
Start: 2023-02-08 — End: 2023-03-09
  Filled 2023-02-08: qty 60, 30d supply, fill #0

## 2023-02-08 ENCOUNTER — Other Ambulatory Visit: Payer: Self-pay

## 2023-02-08 ENCOUNTER — Other Ambulatory Visit (HOSPITAL_COMMUNITY): Payer: Self-pay

## 2023-02-08 MED ORDER — PANTOPRAZOLE SODIUM 40 MG PO TBEC
40.0000 mg | DELAYED_RELEASE_TABLET | Freq: Every day | ORAL | 2 refills | Status: AC
  Filled 2023-02-08 – 2023-08-08 (×4): qty 30, 30d supply, fill #0

## 2023-02-08 MED ORDER — FAMOTIDINE 20 MG PO TABS
20.0000 mg | ORAL_TABLET | Freq: Every evening | ORAL | 2 refills | Status: AC
  Filled 2023-02-08: qty 10, 10d supply, fill #0

## 2023-02-12 ENCOUNTER — Other Ambulatory Visit: Payer: Self-pay | Admitting: Gastroenterology

## 2023-03-07 ENCOUNTER — Encounter (HOSPITAL_COMMUNITY): Payer: Self-pay

## 2023-03-07 ENCOUNTER — Ambulatory Visit (HOSPITAL_COMMUNITY)
Admission: EM | Admit: 2023-03-07 | Discharge: 2023-03-07 | Disposition: A | Discharge status: Home / Self Care | Payer: BC Managed Care – PPO | Attending: Sports Medicine | Admitting: Sports Medicine

## 2023-03-07 DIAGNOSIS — Z1152 Encounter for screening for COVID-19: Secondary | ICD-10-CM | POA: Insufficient documentation

## 2023-03-07 DIAGNOSIS — J209 Acute bronchitis, unspecified: Secondary | ICD-10-CM | POA: Diagnosis present

## 2023-03-07 LAB — POCT INFLUENZA A/B
Influenza A, POC: NEGATIVE
Influenza B, POC: NEGATIVE

## 2023-03-07 MED ORDER — AMOXICILLIN-POT CLAVULANATE 875-125 MG PO TABS: 1.0000 | ORAL_TABLET | Freq: Two times a day (BID) | ORAL | 0 refills | Status: AC

## 2023-03-07 MED ORDER — PREDNISONE 10 MG PO TABS: 40.0000 mg | ORAL_TABLET | Freq: Every day | ORAL | 0 refills | Status: AC

## 2023-03-07 MED ORDER — PROMETHAZINE-DM 6.25-15 MG/5ML PO SYRP: 5.0000 mL | ORAL_SOLUTION | Freq: Four times a day (QID) | ORAL | 0 refills | Status: AC | PRN

## 2023-03-07 NOTE — ED Provider Notes (Signed)
[facility]    CSN: [id number] Arrival date & time: 03/07/23  0802      History   Chief Complaint Chief Complaint  Patient presents with   Cough    HPI Melanie Powell is a 52 y.o. female here for evaluation of productive cough, headache, throat pain for 4 days.  She denies any significant sick contacts.  She has been taking Tylenol, guaifenesin, and Motrin without significant improvement.  She has been hydrating well.  She denies any significant fevers.  She is a smoker, roughly 1/2 pack daily.  Denies history of asthma or COPD.  Does not feel currently short of breath.  Cough Associated symptoms: chills, diaphoresis, myalgias and sore throat   Associated symptoms: no chest pain, no ear pain, no fever, no rhinorrhea, no shortness of breath and no wheezing     Past Medical History:  Diagnosis Date   Anxiety    GERD (gastroesophageal reflux disease)    Hypercholesteremia    Hypertension    Migraine    SVD (spontaneous vaginal delivery)    x 4    Patient Active Problem List   Diagnosis Date Noted   Diarrhea 07/22/2021   Nausea and vomiting 07/22/2021   Generalized abdominal pain 07/22/2021   Disorder of joint of foot 05/21/2019   Gastroesophageal reflux disease 04/28/2019   Hyperlipidemia 03/10/2019   Pain of plantar aspect of heel 02/06/2019   Plantar fasciitis of left foot 02/06/2019   Rupture of peroneal tendon 02/06/2019   Rheumatoid factor positive 12/04/2016   Hypertensive disorder 06/06/2016   Alcohol abuse 07/28/2014   Tobacco abuse 07/28/2014   Amblyopia 07/28/2014   S/P hysterectomy 09/11/2013    Past Surgical History:  Procedure Laterality Date   CARPAL TUNNEL RELEASE Right 03/15/2017   Procedure: Right CARPAL TUNNEL RELEASE;  Surgeon: Betha Loa, MD;  Location: [facility];  Service: Orthopedics;  Laterality: Right;  Bier Block   CARPAL TUNNEL RELEASE Left 01/26/2020   Procedure: LEFT CARPAL TUNNEL RELEASE;  Surgeon: Betha Loa, MD;  Location: [facility];  Service: Orthopedics;  Laterality: Left;  Bier block   FOOT SURGERY Bilateral    Nov and Dec 2021 for bone spurs   LAPAROSCOPIC ASSISTED VAGINAL HYSTERECTOMY N/A 09/11/2013   Procedure: LAPAROSCOPIC ASSISTED VAGINAL HYSTERECTOMY with bilateral salpingectomy and right labial biopsy with posterior fourchette;  Surgeon: Mitchel Honour, DO;  Location: [facility];  Service: Gynecology;  Laterality: N/A;   right side lung surgery     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      Home Medications    Prior to Admission medications   Medication Sig Start Date End Date Taking? Authorizing Provider  amoxicillin-clavulanate (AUGMENTIN) 875-125 MG tablet Take 1 tablet by mouth every 12 (twelve) hours for 5 days. 03/07/23 03/12/23 Yes Marisa Cyphers, MD  predniSONE (DELTASONE) 10 MG tablet Take 4 tablets (40 mg total) by mouth daily for 5 days. 03/07/23 03/12/23 Yes Marisa Cyphers, MD  promethazine-dextromethorphan (PROMETHAZINE-DM) 6.25-15 MG/5ML syrup Take 5 mLs by mouth 4 (four) times daily as needed for up to 7 days for cough. 03/07/23 03/14/23 Yes Marisa Cyphers, MD  Bismuth Subsalicylate (PEPTO-BISMOL PO) Take by mouth. As needed for diarrhea    [provider]  famotidine (PEPCID) 20 MG tablet Take 1 tablet (20 mg total)  by mouth at bedtime when reflux flares. 02/08/23     gabapentin (NEURONTIN) 600 MG tablet Take 600 mg by mouth 2 (two) times daily.    [provider]  HYDROcodone-acetaminophen (NORCO) 7.5-325 MG tablet Take 1/2-1 tablet by mouth up to 2 (two) times daily, as directed. 02/08/23     levothyroxine (SYNTHROID) 50 MCG tablet Take 50 mcg by mouth daily. 07/05/21   [provider]  loperamide (IMODIUM) 2 MG capsule Take 4 mg by mouth as needed for diarrhea or loose stools.    [provider]   metoprolol succinate (TOPROL-XL) 50 MG 24 hr tablet Take 50 mg by mouth daily. 05/22/19   [provider]  pantoprazole (PROTONIX) 40 MG tablet TAKE 1 TABLET BY MOUTH EVERY DAY 09/05/22   Zehr, Shanda Bumps D, PA-C  pantoprazole (PROTONIX) 40 MG tablet Take 1 tablet (40 mg total) by mouth daily. 11/08/22     pantoprazole (PROTONIX) 40 MG tablet Take 1 tablet (40 mg total) by mouth daily. 02/08/23     rosuvastatin (CRESTOR) 20 MG tablet Take 20 mg by mouth daily.    [provider]    Family History Family History  Problem Relation Age of Onset   Brain cancer Mother    Heart failure Father    Parkinson's disease Father    Parkinson's disease Brother    Parkinson's disease Paternal Grandmother    Breast cancer Sister    Ovarian cancer Maternal Aunt     Social History Social History   Tobacco Use   Smoking status: Every Day    Current packs/day: 1.00    Average packs/day: 1 pack/day for 22.0 years (22.0 ttl pk-yrs)    Types: Cigarettes   Smokeless tobacco: Never  Vaping Use   Vaping status: Never Used  Substance Use Topics   Alcohol use: Not Currently    Comment: social - beer   Drug use: No     Allergies   Aspirin, Azithromycin, and Nsaids   Review of Systems Review of Systems  Constitutional:  Positive for chills, diaphoresis and fatigue. Negative for fever.  HENT:  Positive for congestion and sore throat. Negative for ear pain, postnasal drip, rhinorrhea and sinus pain.   Respiratory:  Positive for cough. Negative for chest tightness, shortness of breath and wheezing.   Cardiovascular:  Negative for chest pain.  Gastrointestinal:  Negative for constipation, diarrhea and nausea.  Musculoskeletal:  Positive for myalgias.     Physical Exam Triage Vital Signs ED Triage Vitals  Encounter Vitals Group     BP 03/07/23 0829 139/86     Systolic BP Percentile --      Diastolic BP Percentile --      Pulse Rate 03/07/23 0829 81     Resp 03/07/23 0829 18      Temp 03/07/23 0829 98.2 F (36.8 C)     Temp Source 03/07/23 0829 Oral     SpO2 03/07/23 0829 96 %     Weight --      Height --      Head Circumference --      Peak Flow --      Pain Score 03/07/23 0830 4     Pain Loc --      Pain Education --      Exclude from Growth Chart --    No data found.  Updated Vital Signs BP 139/86 (BP Location: Left Arm)   Pulse 81   Temp 98.2 F (36.8 C) (Oral)  Resp 18   LMP 08/19/2013   SpO2 96%   Visual Acuity Right Eye Distance:   Left Eye Distance:   Bilateral Distance:    Right Eye Near:   Left Eye Near:    Bilateral Near:     Physical Exam Constitutional:      General: She is not in acute distress.    Appearance: Normal appearance. She is ill-appearing.  HENT:     Head: Normocephalic and atraumatic.     Right Ear: External ear normal.     Left Ear: External ear normal.     Mouth/Throat:     Mouth: Mucous membranes are moist.     Pharynx: No posterior oropharyngeal erythema.  Eyes:     Extraocular Movements: Extraocular movements intact.     Conjunctiva/sclera: Conjunctivae normal.     Pupils: Pupils are equal, round, and reactive to light.  Cardiovascular:     Rate and Rhythm: Normal rate and regular rhythm.     Heart sounds: No murmur heard. Pulmonary:     Effort: Pulmonary effort is normal.     Comments: Diminished at bilateral bases.  No significant wheezes, rhonchi, or rales. Musculoskeletal:     Cervical back: Normal range of motion and neck supple.  Neurological:     Mental Status: She is alert.      UC Treatments / Results  Labs (all labs ordered are listed, but only abnormal results are displayed) Labs Reviewed  SARS CORONAVIRUS 2 (TAT 6-24 HRS)  POCT INFLUENZA A/B    EKG   Radiology No results found.  Procedures Procedures (including critical care time)  Medications Ordered in UC Medications - No data to display  Initial Impression / Assessment and Plan / UC Course  I have reviewed the  triage vital signs and the nursing notes.  Pertinent labs & imaging results that were available during my care of the patient were reviewed by me and considered in my medical decision making (see chart for details).    History and physical exam consistent with acute bronchitis.  She has negative flu testing and pending COVID testing at this time.  I recommend a 5-day course of prednisone 40 mg and Augmentin.  I have also called in a prescription for promethazine cough syrup to use as needed over the next week.  Patient's questions were answered and she is in agreement with this plan.  Return precautions reviewed.  Final Clinical Impressions(s) / UC Diagnoses   Final diagnoses:  Acute bronchitis, unspecified organism     Discharge Instructions      You have bronchitis. I have sent in 5 days of prednisone 40mg  and Augmentin to take twice daily for the next 5 days. There is also cough suppressant syrup at the pharmacy for you as well. Recommend continuing tylenol, motrin, and aggressive hydration with resting until you are feeling better.   ED Prescriptions     Medication Sig Dispense Auth. Provider   predniSONE (DELTASONE) 10 MG tablet Take 4 tablets (40 mg total) by mouth daily for 5 days. 20 tablet Marisa Cyphers, MD   amoxicillin-clavulanate (AUGMENTIN) 875-125 MG tablet Take 1 tablet by mouth every 12 (twelve) hours for 5 days. 10 tablet Marisa Cyphers, MD   promethazine-dextromethorphan (PROMETHAZINE-DM) 6.25-15 MG/5ML syrup Take 5 mLs by mouth 4 (four) times daily as needed for up to 7 days for cough. 118 mL Marisa Cyphers, MD      PDMP not reviewed this encounter.   Glean Salen  D, MD 03/07/23 [id number]

## 2023-03-07 NOTE — ED Triage Notes (Signed)
Pt c/o cough, congestion, headache, and fatigue x4 days. States taking OTC meds with no relief.

## 2023-03-07 NOTE — Discharge Instructions (Addendum)
You have bronchitis. I have sent in 5 days of prednisone 40mg  and Augmentin to take twice daily for the next 5 days. There is also cough suppressant syrup at the pharmacy for you as well. Recommend continuing tylenol, motrin, and aggressive hydration with resting until you are feeling better.

## 2023-03-08 ENCOUNTER — Ambulatory Visit (HOSPITAL_COMMUNITY): Payer: BC Managed Care – PPO

## 2023-03-08 LAB — SARS CORONAVIRUS 2 (TAT 6-24 HRS): SARS Coronavirus 2: NEGATIVE

## 2023-03-09 ENCOUNTER — Other Ambulatory Visit (HOSPITAL_COMMUNITY): Payer: Self-pay

## 2023-03-09 MED ORDER — HYDROCODONE-ACETAMINOPHEN 7.5-325 MG PO TABS
1.0000 | ORAL_TABLET | Freq: Two times a day (BID) | ORAL | 0 refills | Status: DC
Start: 2023-03-09 — End: 2023-04-09
  Filled 2023-03-09: qty 60, 30d supply, fill #0

## 2023-03-12 ENCOUNTER — Other Ambulatory Visit (HOSPITAL_COMMUNITY): Payer: Self-pay

## 2023-03-15 ENCOUNTER — Other Ambulatory Visit: Payer: Self-pay | Admitting: Gastroenterology

## 2023-04-04 ENCOUNTER — Other Ambulatory Visit: Payer: Self-pay | Admitting: Gastroenterology

## 2023-04-09 ENCOUNTER — Other Ambulatory Visit (HOSPITAL_COMMUNITY): Payer: Self-pay

## 2023-04-09 MED ORDER — ESZOPICLONE 2 MG PO TABS
1.0000 mg | ORAL_TABLET | Freq: Every day | ORAL | 0 refills | Status: AC
Start: 2023-04-09 — End: ?
  Filled 2023-04-09: qty 17, 17d supply, fill #0

## 2023-04-09 MED ORDER — HYDROCODONE-ACETAMINOPHEN 7.5-325 MG PO TABS
0.5000 | ORAL_TABLET | Freq: Two times a day (BID) | ORAL | 0 refills | Status: DC
Start: 2023-04-09 — End: 2023-05-10
  Filled 2023-04-09: qty 60, 30d supply, fill #0

## 2023-04-09 MED ORDER — FENOFIBRATE 160 MG PO TABS
160.0000 mg | ORAL_TABLET | Freq: Every day | ORAL | 1 refills | Status: AC
  Filled 2023-04-09: qty 90, 90d supply, fill #0
  Filled 2023-10-24: qty 30, 30d supply, fill #1

## 2023-04-09 MED ORDER — LEVOTHYROXINE SODIUM 50 MCG PO TABS
50.0000 ug | ORAL_TABLET | Freq: Every day | ORAL | 1 refills | Status: AC
  Filled 2023-04-09: qty 90, 90d supply, fill #0
  Filled 2023-08-02: qty 90, 90d supply, fill #1

## 2023-04-09 MED ORDER — GABAPENTIN 300 MG PO CAPS
300.0000 mg | ORAL_CAPSULE | Freq: Two times a day (BID) | ORAL | 0 refills | Status: DC | PRN
Start: 2023-04-09 — End: 2023-07-10
  Filled 2023-04-09 – 2023-04-13 (×3): qty 60, 30d supply, fill #0

## 2023-04-12 ENCOUNTER — Other Ambulatory Visit: Payer: Self-pay

## 2023-04-13 ENCOUNTER — Other Ambulatory Visit (HOSPITAL_COMMUNITY): Payer: Self-pay

## 2023-04-16 ENCOUNTER — Other Ambulatory Visit (HOSPITAL_COMMUNITY): Payer: Self-pay

## 2023-05-02 ENCOUNTER — Other Ambulatory Visit: Payer: Self-pay | Admitting: Gastroenterology

## 2023-05-10 ENCOUNTER — Other Ambulatory Visit (HOSPITAL_COMMUNITY): Payer: Self-pay

## 2023-05-10 MED ORDER — HYDROCODONE-ACETAMINOPHEN 7.5-325 MG PO TABS
0.5000 | ORAL_TABLET | Freq: Two times a day (BID) | ORAL | 0 refills | Status: DC
Start: 2023-05-10 — End: 2023-06-08
  Filled 2023-05-10: qty 60, 30d supply, fill #0

## 2023-05-25 ENCOUNTER — Other Ambulatory Visit: Payer: Self-pay | Admitting: Gastroenterology

## 2023-05-25 ENCOUNTER — Other Ambulatory Visit (HOSPITAL_COMMUNITY): Payer: Self-pay

## 2023-06-08 ENCOUNTER — Other Ambulatory Visit (HOSPITAL_COMMUNITY): Payer: Self-pay

## 2023-06-08 MED ORDER — HYDROCODONE-ACETAMINOPHEN 7.5-325 MG PO TABS
1.0000 | ORAL_TABLET | Freq: Two times a day (BID) | ORAL | 0 refills | Status: DC
  Filled 2023-06-08: qty 60, 30d supply, fill #0

## 2023-07-10 ENCOUNTER — Other Ambulatory Visit (HOSPITAL_COMMUNITY): Payer: Self-pay

## 2023-07-10 MED ORDER — GABAPENTIN 300 MG PO CAPS
300.0000 mg | ORAL_CAPSULE | Freq: Two times a day (BID) | ORAL | 0 refills | Status: AC | PRN
  Filled 2023-07-10: qty 60, 30d supply, fill #0

## 2023-07-10 MED ORDER — HYDROCODONE-ACETAMINOPHEN 7.5-325 MG PO TABS
0.5000 | ORAL_TABLET | Freq: Two times a day (BID) | ORAL | 0 refills | Status: AC
  Filled 2023-07-10: qty 60, 30d supply, fill #0

## 2023-07-29 ENCOUNTER — Other Ambulatory Visit: Payer: Self-pay | Admitting: Gastroenterology

## 2023-08-02 ENCOUNTER — Other Ambulatory Visit (HOSPITAL_COMMUNITY): Payer: Self-pay

## 2023-08-03 ENCOUNTER — Other Ambulatory Visit (HOSPITAL_COMMUNITY): Payer: Self-pay

## 2023-08-08 ENCOUNTER — Other Ambulatory Visit (HOSPITAL_COMMUNITY): Payer: Self-pay

## 2023-08-10 ENCOUNTER — Other Ambulatory Visit (HOSPITAL_COMMUNITY): Payer: Self-pay

## 2023-08-10 MED ORDER — PANTOPRAZOLE SODIUM 40 MG PO TBEC
40.0000 mg | DELAYED_RELEASE_TABLET | Freq: Every day | ORAL | 1 refills | Status: AC
  Filled 2023-09-10: qty 90, 90d supply, fill #0
  Filled 2023-12-07: qty 30, 30d supply, fill #1
  Filled 2024-06-25: qty 30, 30d supply, fill #2

## 2023-08-10 MED ORDER — FENOFIBRATE 160 MG PO TABS
160.0000 mg | ORAL_TABLET | Freq: Every day | ORAL | 1 refills | Status: AC
  Filled 2023-08-10: qty 30, 30d supply, fill #0
  Filled 2023-12-04: qty 30, 30d supply, fill #1
  Filled 2024-06-25: qty 30, 30d supply, fill #2

## 2023-08-10 MED ORDER — DICYCLOMINE HCL 10 MG PO CAPS
10.0000 mg | ORAL_CAPSULE | Freq: Two times a day (BID) | ORAL | 3 refills | Status: AC
  Filled 2023-08-10: qty 60, 30d supply, fill #0

## 2023-08-10 MED ORDER — TRAZODONE HCL 50 MG PO TABS
50.0000 mg | ORAL_TABLET | Freq: Every evening | ORAL | 3 refills | Status: AC | PRN
  Filled 2023-08-10: qty 30, 30d supply, fill #0

## 2023-08-17 ENCOUNTER — Other Ambulatory Visit (HOSPITAL_COMMUNITY): Payer: Self-pay

## 2023-08-22 ENCOUNTER — Other Ambulatory Visit (HOSPITAL_COMMUNITY): Payer: Self-pay

## 2023-08-22 MED ORDER — AMOXICILLIN-POT CLAVULANATE 875-125 MG PO TABS
1.0000 | ORAL_TABLET | Freq: Two times a day (BID) | ORAL | 0 refills | Status: AC
  Filled 2023-08-22: qty 20, 10d supply, fill #0

## 2023-08-22 MED ORDER — ESZOPICLONE 2 MG PO TABS
2.0000 mg | ORAL_TABLET | Freq: Every evening | ORAL | 2 refills | Status: AC
  Filled 2023-08-22: qty 15, 15d supply, fill #0
  Filled 2023-09-10: qty 15, 15d supply, fill #1

## 2023-08-22 MED ORDER — ALBUTEROL SULFATE HFA 108 (90 BASE) MCG/ACT IN AERS
2.0000 | INHALATION_SPRAY | Freq: Four times a day (QID) | RESPIRATORY_TRACT | 0 refills | Status: AC | PRN
  Filled 2023-08-22: qty 18, 25d supply, fill #0

## 2023-08-22 MED ORDER — METHYLPREDNISOLONE 4 MG PO TBPK
ORAL_TABLET | ORAL | 0 refills | Status: DC
  Filled 2023-08-22: qty 21, 6d supply, fill #0

## 2023-08-22 MED ORDER — PROMETHAZINE-DM 6.25-15 MG/5ML PO SYRP
5.0000 mL | ORAL_SOLUTION | Freq: Four times a day (QID) | ORAL | 0 refills | Status: DC | PRN
  Filled 2023-08-22: qty 120, 6d supply, fill #0

## 2023-08-31 ENCOUNTER — Encounter (HOSPITAL_COMMUNITY): Payer: Self-pay

## 2023-08-31 ENCOUNTER — Other Ambulatory Visit (HOSPITAL_COMMUNITY): Payer: Self-pay

## 2023-08-31 MED ORDER — BUPRENORPHINE HCL-NALOXONE HCL 4-1 MG SL FILM
1.0000 | ORAL_FILM | Freq: Three times a day (TID) | SUBLINGUAL | 0 refills | Status: DC | PRN
  Filled 2023-08-31 – 2023-09-03 (×5): qty 90, 30d supply, fill #0

## 2023-08-31 MED ORDER — GABAPENTIN 300 MG PO CAPS
300.0000 mg | ORAL_CAPSULE | Freq: Two times a day (BID) | ORAL | 0 refills | Status: DC
  Filled 2023-08-31: qty 60, 30d supply, fill #0

## 2023-09-03 ENCOUNTER — Other Ambulatory Visit (HOSPITAL_COMMUNITY): Payer: Self-pay

## 2023-09-04 ENCOUNTER — Other Ambulatory Visit (HOSPITAL_COMMUNITY): Payer: Self-pay

## 2023-09-10 ENCOUNTER — Other Ambulatory Visit (HOSPITAL_COMMUNITY): Payer: Self-pay

## 2023-09-13 ENCOUNTER — Other Ambulatory Visit (HOSPITAL_COMMUNITY): Payer: Self-pay

## 2023-09-21 ENCOUNTER — Other Ambulatory Visit (HOSPITAL_COMMUNITY): Payer: Self-pay

## 2023-09-21 MED ORDER — METHOCARBAMOL 500 MG PO TABS
1000.0000 mg | ORAL_TABLET | Freq: Three times a day (TID) | ORAL | 0 refills | Status: AC | PRN
  Filled 2023-09-21: qty 42, 7d supply, fill #0

## 2023-10-08 ENCOUNTER — Other Ambulatory Visit (HOSPITAL_COMMUNITY): Payer: Self-pay

## 2023-10-08 MED ORDER — BUPRENORPHINE HCL-NALOXONE HCL 4-1 MG SL FILM
1.0000 | ORAL_FILM | Freq: Three times a day (TID) | SUBLINGUAL | 0 refills | Status: AC | PRN
  Filled 2023-10-08: qty 90, 30d supply, fill #0

## 2023-10-08 MED ORDER — GABAPENTIN 300 MG PO CAPS
300.0000 mg | ORAL_CAPSULE | Freq: Two times a day (BID) | ORAL | 0 refills | Status: DC
  Filled 2023-10-08: qty 60, 30d supply, fill #0

## 2023-10-09 ENCOUNTER — Other Ambulatory Visit (HOSPITAL_COMMUNITY): Payer: Self-pay

## 2023-10-09 MED ORDER — AIRSUPRA 90-80 MCG/ACT IN AERO
2.0000 | INHALATION_SPRAY | Freq: Four times a day (QID) | RESPIRATORY_TRACT | 2 refills | Status: DC | PRN
  Filled 2023-10-09 (×2): qty 32.1, 90d supply, fill #0
  Filled 2023-10-10: qty 32.1, 30d supply, fill #0

## 2023-10-09 MED ORDER — LEVOFLOXACIN 500 MG PO TABS
500.0000 mg | ORAL_TABLET | Freq: Every day | ORAL | 0 refills | Status: AC
  Filled 2023-10-09: qty 10, 10d supply, fill #0

## 2023-10-09 MED ORDER — PROMETHAZINE-DM 6.25-15 MG/5ML PO SYRP
5.0000 mL | ORAL_SOLUTION | Freq: Four times a day (QID) | ORAL | 0 refills | Status: AC | PRN
  Filled 2023-10-09: qty 120, 6d supply, fill #0

## 2023-10-09 MED ORDER — METHYLPREDNISOLONE 4 MG PO TBPK
ORAL_TABLET | ORAL | 0 refills | Status: AC
Start: 2023-10-09 — End: ?
  Filled 2023-10-09: qty 21, 6d supply, fill #0

## 2023-10-10 ENCOUNTER — Other Ambulatory Visit (HOSPITAL_COMMUNITY): Payer: Self-pay

## 2023-10-10 MED ORDER — ALBUTEROL SULFATE HFA 108 (90 BASE) MCG/ACT IN AERS
2.0000 | INHALATION_SPRAY | RESPIRATORY_TRACT | 1 refills | Status: AC
Start: 2023-10-10 — End: ?
  Filled 2023-10-10: qty 18, 25d supply, fill #0
  Filled 2023-10-10: qty 18, 16d supply, fill #0

## 2023-10-11 ENCOUNTER — Other Ambulatory Visit: Payer: Self-pay

## 2023-10-17 ENCOUNTER — Other Ambulatory Visit (HOSPITAL_COMMUNITY): Payer: Self-pay

## 2023-10-17 ENCOUNTER — Encounter (HOSPITAL_COMMUNITY): Payer: Self-pay

## 2023-10-22 ENCOUNTER — Other Ambulatory Visit: Payer: Self-pay | Admitting: Gastroenterology

## 2023-10-24 ENCOUNTER — Other Ambulatory Visit (HOSPITAL_COMMUNITY): Payer: Self-pay

## 2023-10-26 ENCOUNTER — Other Ambulatory Visit (HOSPITAL_COMMUNITY): Payer: Self-pay

## 2023-10-26 MED ORDER — BUPRENORPHINE HCL-NALOXONE HCL 8-2 MG SL FILM
ORAL_FILM | Freq: Four times a day (QID) | SUBLINGUAL | 0 refills | Status: DC | PRN
  Filled 2023-10-30 – 2023-10-31 (×2): qty 28, 7d supply, fill #0
  Filled 2023-11-02: qty 28, 14d supply, fill #0

## 2023-10-26 MED ORDER — GABAPENTIN 300 MG PO CAPS
300.0000 mg | ORAL_CAPSULE | Freq: Two times a day (BID) | ORAL | 0 refills | Status: AC
  Filled 2024-04-25: qty 60, 30d supply, fill #0

## 2023-10-30 ENCOUNTER — Other Ambulatory Visit (HOSPITAL_COMMUNITY): Payer: Self-pay

## 2023-10-31 ENCOUNTER — Other Ambulatory Visit (HOSPITAL_COMMUNITY): Payer: Self-pay

## 2023-11-01 ENCOUNTER — Other Ambulatory Visit (HOSPITAL_COMMUNITY): Payer: Self-pay

## 2023-11-02 ENCOUNTER — Other Ambulatory Visit (HOSPITAL_COMMUNITY): Payer: Self-pay

## 2023-11-08 ENCOUNTER — Other Ambulatory Visit (HOSPITAL_COMMUNITY): Payer: Self-pay

## 2023-11-08 MED ORDER — GABAPENTIN 300 MG PO CAPS
300.0000 mg | ORAL_CAPSULE | Freq: Two times a day (BID) | ORAL | 1 refills | Status: AC
  Filled 2023-11-08: qty 60, 30d supply, fill #0

## 2023-11-08 MED ORDER — BUPRENORPHINE HCL-NALOXONE HCL 8-2 MG SL FILM
1.0000 | ORAL_FILM | Freq: Four times a day (QID) | SUBLINGUAL | 1 refills | Status: AC | PRN
  Filled 2023-11-16: qty 56, 28d supply, fill #0
  Filled 2023-12-12: qty 56, 28d supply, fill #1

## 2023-11-15 ENCOUNTER — Other Ambulatory Visit (HOSPITAL_COMMUNITY): Payer: Self-pay

## 2023-11-16 ENCOUNTER — Other Ambulatory Visit (HOSPITAL_COMMUNITY): Payer: Self-pay

## 2023-11-19 ENCOUNTER — Other Ambulatory Visit (HOSPITAL_COMMUNITY): Payer: Self-pay

## 2023-11-25 ENCOUNTER — Other Ambulatory Visit: Payer: Self-pay | Admitting: Gastroenterology

## 2023-11-28 ENCOUNTER — Other Ambulatory Visit: Payer: Self-pay | Admitting: Gastroenterology

## 2023-12-04 ENCOUNTER — Other Ambulatory Visit: Payer: Self-pay

## 2023-12-04 ENCOUNTER — Other Ambulatory Visit (HOSPITAL_COMMUNITY): Payer: Self-pay

## 2023-12-07 ENCOUNTER — Other Ambulatory Visit (HOSPITAL_COMMUNITY): Payer: Self-pay

## 2023-12-12 ENCOUNTER — Other Ambulatory Visit (HOSPITAL_COMMUNITY): Payer: Self-pay

## 2023-12-17 ENCOUNTER — Other Ambulatory Visit: Payer: Self-pay | Admitting: Gastroenterology

## 2023-12-25 ENCOUNTER — Other Ambulatory Visit (HOSPITAL_COMMUNITY): Payer: Self-pay

## 2023-12-26 ENCOUNTER — Other Ambulatory Visit (HOSPITAL_COMMUNITY): Payer: Self-pay

## 2024-01-01 ENCOUNTER — Other Ambulatory Visit (HOSPITAL_COMMUNITY): Payer: Self-pay

## 2024-01-07 ENCOUNTER — Other Ambulatory Visit: Payer: Self-pay | Admitting: Gastroenterology

## 2024-01-07 ENCOUNTER — Other Ambulatory Visit (HOSPITAL_COMMUNITY): Payer: Self-pay

## 2024-01-15 ENCOUNTER — Other Ambulatory Visit (HOSPITAL_COMMUNITY): Payer: Self-pay

## 2024-01-15 MED ORDER — BUPRENORPHINE HCL-NALOXONE HCL 8-2 MG SL FILM
ORAL_FILM | Freq: Four times a day (QID) | SUBLINGUAL | 1 refills | Status: DC | PRN
  Filled 2024-01-15: qty 60, 30d supply, fill #0
  Filled 2024-02-13: qty 60, 30d supply, fill #1

## 2024-01-15 MED ORDER — GABAPENTIN 300 MG PO CAPS
300.0000 mg | ORAL_CAPSULE | Freq: Two times a day (BID) | ORAL | 1 refills | Status: AC
  Filled 2024-01-15: qty 60, 30d supply, fill #0

## 2024-01-28 ENCOUNTER — Other Ambulatory Visit: Payer: Self-pay | Admitting: Gastroenterology

## 2024-02-13 ENCOUNTER — Other Ambulatory Visit (HOSPITAL_COMMUNITY): Payer: Self-pay

## 2024-02-14 ENCOUNTER — Other Ambulatory Visit (HOSPITAL_COMMUNITY): Payer: Self-pay

## 2024-03-14 ENCOUNTER — Other Ambulatory Visit (HOSPITAL_COMMUNITY): Payer: Self-pay

## 2024-03-14 ENCOUNTER — Encounter (HOSPITAL_COMMUNITY): Payer: Self-pay

## 2024-03-14 MED ORDER — BUPRENORPHINE HCL-NALOXONE HCL 8-2 MG SL FILM
1.0000 | ORAL_FILM | Freq: Four times a day (QID) | SUBLINGUAL | 0 refills | Status: AC | PRN
  Filled 2024-03-14: qty 22, 11d supply, fill #0

## 2024-03-14 MED ORDER — GABAPENTIN 300 MG PO CAPS
300.0000 mg | ORAL_CAPSULE | Freq: Two times a day (BID) | ORAL | 1 refills | Status: AC
  Filled 2024-03-14: qty 60, 30d supply, fill #0

## 2024-04-01 ENCOUNTER — Other Ambulatory Visit (HOSPITAL_COMMUNITY): Payer: Self-pay

## 2024-04-02 ENCOUNTER — Other Ambulatory Visit (HOSPITAL_COMMUNITY): Payer: Self-pay

## 2024-04-02 MED ORDER — BUPRENORPHINE HCL-NALOXONE HCL 8-2 MG SL FILM
1.0000 | ORAL_FILM | Freq: Four times a day (QID) | SUBLINGUAL | 0 refills | Status: AC
  Filled 2024-04-02: qty 12, 6d supply, fill #0

## 2024-04-02 MED ORDER — BUPRENORPHINE HCL-NALOXONE HCL 8-2 MG SL FILM
1.0000 | ORAL_FILM | Freq: Four times a day (QID) | SUBLINGUAL | 0 refills | Status: DC
  Filled 2024-04-14: qty 12, 6d supply, fill #0

## 2024-04-14 ENCOUNTER — Other Ambulatory Visit (HOSPITAL_COMMUNITY): Payer: Self-pay

## 2024-04-15 ENCOUNTER — Other Ambulatory Visit (HOSPITAL_COMMUNITY): Payer: Self-pay

## 2024-04-25 ENCOUNTER — Other Ambulatory Visit (HOSPITAL_COMMUNITY): Payer: Self-pay

## 2024-05-23 ENCOUNTER — Other Ambulatory Visit (HOSPITAL_COMMUNITY): Payer: Self-pay

## 2024-05-23 MED ORDER — GABAPENTIN 300 MG PO CAPS
300.0000 mg | ORAL_CAPSULE | Freq: Two times a day (BID) | ORAL | 1 refills | Status: AC
  Filled 2024-05-23: qty 60, 30d supply, fill #0
  Filled 2024-06-25: qty 60, 30d supply, fill #1

## 2024-05-23 MED ORDER — BUPRENORPHINE HCL-NALOXONE HCL 8-2 MG SL FILM
ORAL_FILM | Freq: Four times a day (QID) | SUBLINGUAL | 0 refills | Status: AC
  Filled 2024-05-23: qty 60, 30d supply, fill #0

## 2024-05-23 MED ORDER — NALOXONE HCL 4 MG/0.1ML NA LIQD
1.0000 | NASAL | 0 refills | Status: AC | PRN
  Filled 2024-05-23: qty 2, 2d supply, fill #0

## 2024-06-25 ENCOUNTER — Other Ambulatory Visit (HOSPITAL_COMMUNITY): Payer: Self-pay

## 2024-06-30 ENCOUNTER — Other Ambulatory Visit (HOSPITAL_COMMUNITY): Payer: Self-pay

## 2024-07-01 ENCOUNTER — Other Ambulatory Visit (HOSPITAL_COMMUNITY): Payer: Self-pay

## 2024-07-01 ENCOUNTER — Encounter (HOSPITAL_COMMUNITY): Payer: Self-pay

## 2024-07-03 ENCOUNTER — Other Ambulatory Visit (HOSPITAL_COMMUNITY): Payer: Self-pay

## 2024-07-07 ENCOUNTER — Other Ambulatory Visit (HOSPITAL_COMMUNITY): Payer: Self-pay

## 2024-07-18 ENCOUNTER — Other Ambulatory Visit (HOSPITAL_COMMUNITY): Payer: Self-pay
